# Patient Record
Sex: Female | Born: 1985 | Race: Black or African American | Hispanic: No | Marital: Single | Smoking: Current every day smoker
Health system: Southern US, Community
[De-identification: ages and names within clinical notes are randomized; demographics above are authoritative.]

## PROBLEM LIST (undated history)

## (undated) DIAGNOSIS — I10 Essential (primary) hypertension: Secondary | ICD-10-CM

## (undated) HISTORY — PX: LAPAROSCOPIC GASTRIC SLEEVE RESECTION: SHX5895

## (undated) HISTORY — PX: BREAST SURGERY: SHX581

---

## 2018-04-14 ENCOUNTER — Emergency Department (HOSPITAL_COMMUNITY): Payer: Medicaid - Out of State

## 2018-04-14 ENCOUNTER — Encounter (HOSPITAL_COMMUNITY): Payer: Self-pay

## 2018-04-14 ENCOUNTER — Other Ambulatory Visit: Payer: Self-pay

## 2018-04-14 ENCOUNTER — Emergency Department (HOSPITAL_COMMUNITY)
Admission: EM | Admit: 2018-04-14 | Discharge: 2018-04-15 | Disposition: A | Discharge status: Home / Self Care | Payer: Medicaid - Out of State | Attending: Emergency Medicine | Admitting: Emergency Medicine

## 2018-04-14 DIAGNOSIS — N3000 Acute cystitis without hematuria: Secondary | ICD-10-CM | POA: Insufficient documentation

## 2018-04-14 DIAGNOSIS — F17210 Nicotine dependence, cigarettes, uncomplicated: Secondary | ICD-10-CM | POA: Diagnosis not present

## 2018-04-14 DIAGNOSIS — R1031 Right lower quadrant pain: Secondary | ICD-10-CM | POA: Diagnosis present

## 2018-04-14 LAB — COMPREHENSIVE METABOLIC PANEL
ALT: 9 U/L (ref 0–44)
AST: 12 U/L — ABNORMAL LOW (ref 15–41)
Albumin: 3.8 g/dL (ref 3.5–5.0)
Alkaline Phosphatase: 49 U/L (ref 38–126)
Anion gap: 11 (ref 5–15)
BUN: 10 mg/dL (ref 6–20)
CO2: 25 mmol/L (ref 22–32)
Calcium: 8.7 mg/dL — ABNORMAL LOW (ref 8.9–10.3)
Chloride: 103 mmol/L (ref 98–111)
Creatinine, Ser: 0.67 mg/dL (ref 0.44–1.00)
GFR calc Af Amer: >60 mL/min (ref >60)
GFR calc non Af Amer: >60 mL/min (ref >60)
Glucose, Bld: 107 mg/dL — ABNORMAL HIGH (ref 70–99)
Potassium: 3.4 mmol/L — ABNORMAL LOW (ref 3.5–5.1)
Sodium: 139 mmol/L (ref 135–145)
Total Bilirubin: 0.6 mg/dL (ref 0.3–1.2)
Total Protein: 7.6 g/dL (ref 6.5–8.1)

## 2018-04-14 LAB — URINALYSIS, ROUTINE W REFLEX MICROSCOPIC
Bilirubin Urine: NEGATIVE
Glucose, UA: NEGATIVE mg/dL
Ketones, ur: NEGATIVE mg/dL
Nitrite: POSITIVE — AB
Protein, ur: 100 mg/dL — AB
Specific Gravity, Urine: 1.02 (ref 1.005–1.030)
WBC, UA: >50 WBC/hpf — ABNORMAL HIGH (ref 0–5)
pH: 6 (ref 5.0–8.0)

## 2018-04-14 LAB — CBC
HCT: 31.2 % — ABNORMAL LOW (ref 36.0–46.0)
Hemoglobin: 9.5 g/dL — ABNORMAL LOW (ref 12.0–15.0)
MCH: 22.2 pg — ABNORMAL LOW (ref 26.0–34.0)
MCHC: 30.4 g/dL (ref 30.0–36.0)
MCV: 72.9 fL — ABNORMAL LOW (ref 78.0–100.0)
Platelets: 190 10*3/uL (ref 150–400)
RBC: 4.28 MIL/uL (ref 3.87–5.11)
RDW: 17.8 % — ABNORMAL HIGH (ref 11.5–15.5)
WBC: 8.8 10*3/uL (ref 4.0–10.5)

## 2018-04-14 LAB — I-STAT BETA HCG BLOOD, ED (MC, WL, AP ONLY): I-stat hCG, quantitative: <5 m[IU]/mL (ref <5)

## 2018-04-14 LAB — LIPASE, BLOOD: Lipase: 23 U/L (ref 11–51)

## 2018-04-14 MED ORDER — SODIUM CHLORIDE 0.9 % IV SOLN
1.0000 g | Freq: Once | INTRAVENOUS | Status: AC
  Administered 2018-04-14: 1 g via INTRAVENOUS
  Filled 2018-04-14: qty 10

## 2018-04-14 MED ORDER — ACETAMINOPHEN 500 MG PO TABS
1000.0000 mg | ORAL_TABLET | Freq: Once | ORAL | Status: AC
  Administered 2018-04-14: 1000 mg via ORAL
  Filled 2018-04-14: qty 2

## 2018-04-14 MED ORDER — SODIUM CHLORIDE 0.9 % IV BOLUS
1000.0000 mL | Freq: Once | INTRAVENOUS | Status: AC
  Administered 2018-04-14: 1000 mL via INTRAVENOUS

## 2018-04-14 MED ORDER — IOPAMIDOL (ISOVUE-300) INJECTION 61%
INTRAVENOUS | Status: AC
  Filled 2018-04-14: qty 100

## 2018-04-14 MED ORDER — IOPAMIDOL (ISOVUE-300) INJECTION 61%
100.0000 mL | Freq: Once | INTRAVENOUS | Status: AC | PRN
  Administered 2018-04-14: 100 mL via INTRAVENOUS

## 2018-04-14 MED ORDER — HYDROMORPHONE HCL 1 MG/ML IJ SOLN
1.0000 mg | Freq: Once | INTRAMUSCULAR | Status: AC
  Administered 2018-04-14: 1 mg via INTRAVENOUS
  Filled 2018-04-14: qty 1

## 2018-04-14 MED ORDER — SODIUM CHLORIDE 0.9 % IJ SOLN
INTRAMUSCULAR | Status: AC
  Filled 2018-04-14: qty 50

## 2018-04-14 MED ORDER — ONDANSETRON HCL 4 MG/2ML IJ SOLN
4.0000 mg | Freq: Once | INTRAMUSCULAR | Status: AC
  Administered 2018-04-14: 4 mg via INTRAVENOUS
  Filled 2018-04-14: qty 2

## 2018-04-14 NOTE — ED Triage Notes (Signed)
Patient c/o headache, RLQ abdominal pain, and body aches x 2 days.

## 2018-04-14 NOTE — ED Notes (Signed)
Patient ambulated to BR with no assistance and tolerated well.  

## 2018-04-14 NOTE — ED Provider Notes (Signed)
[facility]-EMERGENCY DEPT Provider Note   CSN: [id number] Arrival date & time: 04/14/18  1755     History   Chief Complaint Chief Complaint  Patient presents with  . Headache  . Generalized Body Aches  . Abdominal Pain    HPI Bailey Sloan is a 32 y.o. female.  Patient presents to the ED with a chief complaint of abdominal pain.  She states that the symptoms started 2 days ago. She reports gradually worsening pain, with significant pain in her right lower abdomen.  She reports associated generalized body aches, fever, nausea, and vomiting.  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  She still has her gallbladder and appendix.  She has not been able to tolerate eating or drinking today.  She rates her pain as severe.  It does not radiate to the back.  It is worsened with movement and palpation. No urinary complaints.  The history is provided by the patient. No language interpreter was used.    History reviewed. No pertinent past medical history.  There are no active problems to display for this patient.   Past Surgical History:  Procedure Laterality Date  . BREAST SURGERY    . LAPAROSCOPIC GASTRIC SLEEVE RESECTION       OB History   None      Home Medications    Prior to Admission medications   Not on File    Family History Family History  Problem Relation Age of Onset  . Healthy Mother     Social History Social History   Tobacco Use  . Smoking status: Current Every Day Smoker    Packs/day: 0.25    Types: Cigarettes  . Smokeless tobacco: Never Used  Substance Use Topics  . Alcohol use: Yes    Comment: occasionally  . Drug use: Never     Allergies   Patient has no known allergies.   Review of Systems Review of Systems  All other systems reviewed and are negative.    Physical Exam Updated Vital Signs BP (!) 140/97 (BP Location: Left Arm)   Pulse (!) 112   Temp (!) 101.4 F (38.6 C) (Oral)   Resp 20    Ht 5\' 4"  (1.626 m)   Wt 65.8 kg   LMP 03/31/2018 (Approximate)   SpO2 98%   BMI 24.89 kg/m   Physical Exam  Constitutional: She is oriented to person, place, and time. She appears well-developed and well-nourished.  HENT:  Head: Normocephalic and atraumatic.  Eyes: Pupils are equal, round, and reactive to light. Conjunctivae and EOM are normal.  Neck: Normal range of motion. Neck supple.  Cardiovascular: Normal rate and regular rhythm. Exam reveals no gallop and no friction rub.  No murmur heard. Pulmonary/Chest: Effort normal and breath sounds normal. No respiratory distress. She has no wheezes. She has no rales. She exhibits no tenderness.  Abdominal: Soft. Bowel sounds are normal. She exhibits no distension and no mass. There is tenderness. There is no rebound and no guarding.  Diffuse abdominal tenderness  Musculoskeletal: Normal range of motion. She exhibits no edema or tenderness.  Neurological: She is alert and oriented to person, place, and time.  Skin: Skin is warm and dry.  Psychiatric: She has a normal mood and affect. Her behavior is normal. Judgment and thought content normal.  Nursing note and vitals reviewed.    ED Treatments / Results  Labs (all labs ordered are listed, but only abnormal results are displayed) Labs Reviewed  COMPREHENSIVE  METABOLIC PANEL - Abnormal; Notable for the following components:      Result Value   Potassium 3.4 (*)    Glucose, Bld 107 (*)    Calcium 8.7 (*)    AST 12 (*)    All other components within normal limits  CBC - Abnormal; Notable for the following components:   Hemoglobin 9.5 (*)    HCT 31.2 (*)    MCV 72.9 (*)    MCH 22.2 (*)    RDW 17.8 (*)    All other components within normal limits  LIPASE, BLOOD  URINALYSIS, ROUTINE W REFLEX MICROSCOPIC  I-STAT BETA HCG BLOOD, ED ([facility], [facility], [facility] ONLY)    EKG None  Radiology Ct Abdomen Pelvis W Contrast  Result Date: 04/14/2018 CLINICAL DATA:  Abdominal pain with fever EXAM:  CT ABDOMEN AND PELVIS WITH CONTRAST TECHNIQUE: Multidetector CT imaging of the abdomen and pelvis was performed using the standard protocol following bolus administration of intravenous contrast. CONTRAST:  ISOVUE-300 IOPAMIDOL (ISOVUE-300) INJECTION 61% COMPARISON:  None. FINDINGS: Lower chest: No acute abnormality. Hepatobiliary: No focal liver abnormality is seen. No gallstones, gallbladder wall thickening, or biliary dilatation. Pancreas: Unremarkable. No pancreatic ductal dilatation or surrounding inflammatory changes. Spleen: Normal in size without focal abnormality. Adrenals/Urinary Tract: Adrenal glands are unremarkable. Kidneys are normal, without renal calculi, focal lesion, or hydronephrosis. Bladder is unremarkable. Stomach/Bowel: Postsurgical changes of the stomach. No dilated small bowel. No colon wall thickening. Negative appendix. Vascular/Lymphatic: No significant vascular findings are present. No enlarged abdominal or pelvic lymph nodes. Reproductive: Uterus unremarkable. Ring-enhancing cyst in the right adnexa. Other: Small free fluid in the pelvis.  No free air. Musculoskeletal: No acute or significant osseous findings. IMPRESSION: No definite CT evidence for acute intra-abdominal or pelvic abnormality. Small amount of free fluid in the pelvis. Electronically Signed   By: Jasmine Pang M.D.   On: 04/14/2018 22:55    Procedures Procedures (including critical care time)  Medications Ordered in ED Medications  HYDROmorphone (DILAUDID) injection 1 mg (has no administration in time range)  ondansetron (ZOFRAN) injection 4 mg (has no administration in time range)  sodium chloride 0.9 % bolus 1,000 mL (has no administration in time range)  acetaminophen (TYLENOL) tablet 1,000 mg (has no administration in time range)     Initial Impression / Assessment and Plan / ED Course  I have reviewed the triage vital signs and the nursing notes.  Pertinent labs & imaging results that were  available during my care of the patient were reviewed by me and considered in my medical decision making (see chart for details).    Patient with significant abdominal tenderness, specifically in the RLQ.  Also has fever to 101.4.  No urinary complaints.  Will check CT to rule out appy. Negative HCG.  10:30 PM UA is consistent with UTI, pyelonephritis is strongly considered, but given about of RLQ tenderness, still feel that CT is needed to rule out appy.  CT negative for appy.  No other acute process identified on CT.  Will treat for UTI.  [place] narcotic database reviewed, no prior records found. Final Clinical Impressions(s) / ED Diagnoses   Final diagnoses:  Acute cystitis without hematuria    ED Discharge Orders         Ordered    HYDROcodone-acetaminophen (NORCO/VICODIN) 5-325 MG tablet  Every 6 hours PRN     04/15/18 0013    cephALEXin (KEFLEX) 500 MG capsule  2 times daily     04/15/18 0013  Roxy Horseman, PA-C 04/15/18 0014    Tilden Fossa, MD 04/16/18 [phone]

## 2018-04-14 NOTE — ED Notes (Signed)
Patient transported to CT 

## 2018-04-15 MED ORDER — CEPHALEXIN 500 MG PO CAPS: 500.0000 mg | ORAL_CAPSULE | Freq: Two times a day (BID) | ORAL | 0 refills | Status: DC

## 2018-04-15 MED ORDER — HYDROCODONE-ACETAMINOPHEN 5-325 MG PO TABS
2.0000 | ORAL_TABLET | Freq: Once | ORAL | Status: AC
  Administered 2018-04-15: 2 via ORAL
  Filled 2018-04-15: qty 2

## 2018-04-15 MED ORDER — HYDROCODONE-ACETAMINOPHEN 5-325 MG PO TABS: 1.0000 | ORAL_TABLET | Freq: Four times a day (QID) | ORAL | 0 refills | Status: DC | PRN

## 2019-03-14 ENCOUNTER — Encounter (HOSPITAL_COMMUNITY): Payer: Self-pay | Admitting: Emergency Medicine

## 2019-03-14 ENCOUNTER — Other Ambulatory Visit: Payer: Self-pay

## 2019-03-14 ENCOUNTER — Emergency Department (HOSPITAL_COMMUNITY): Payer: Medicaid Other

## 2019-03-14 ENCOUNTER — Emergency Department (HOSPITAL_COMMUNITY)
Admission: EM | Admit: 2019-03-14 | Discharge: 2019-03-14 | Disposition: A | Discharge status: Home / Self Care | Payer: Medicaid Other | Attending: Emergency Medicine | Admitting: Emergency Medicine

## 2019-03-14 DIAGNOSIS — Y999 Unspecified external cause status: Secondary | ICD-10-CM | POA: Diagnosis not present

## 2019-03-14 DIAGNOSIS — Y929 Unspecified place or not applicable: Secondary | ICD-10-CM | POA: Diagnosis not present

## 2019-03-14 DIAGNOSIS — S02832A Fracture of medial orbital wall, left side, initial encounter for closed fracture: Secondary | ICD-10-CM | POA: Insufficient documentation

## 2019-03-14 DIAGNOSIS — F17210 Nicotine dependence, cigarettes, uncomplicated: Secondary | ICD-10-CM | POA: Insufficient documentation

## 2019-03-14 DIAGNOSIS — S0993XA Unspecified injury of face, initial encounter: Secondary | ICD-10-CM | POA: Diagnosis present

## 2019-03-14 DIAGNOSIS — R109 Unspecified abdominal pain: Secondary | ICD-10-CM

## 2019-03-14 DIAGNOSIS — R9389 Abnormal findings on diagnostic imaging of other specified body structures: Secondary | ICD-10-CM | POA: Diagnosis not present

## 2019-03-14 DIAGNOSIS — T7611XA Adult physical abuse, suspected, initial encounter: Secondary | ICD-10-CM | POA: Diagnosis not present

## 2019-03-14 DIAGNOSIS — Y939 Activity, unspecified: Secondary | ICD-10-CM | POA: Diagnosis not present

## 2019-03-14 DIAGNOSIS — Q2547 Right aortic arch: Secondary | ICD-10-CM

## 2019-03-14 DIAGNOSIS — R0789 Other chest pain: Secondary | ICD-10-CM | POA: Insufficient documentation

## 2019-03-14 LAB — COMPREHENSIVE METABOLIC PANEL
ALT: 12 U/L (ref 0–44)
AST: 15 U/L (ref 15–41)
Albumin: 4.2 g/dL (ref 3.5–5.0)
Alkaline Phosphatase: 62 U/L (ref 38–126)
Anion gap: 13 (ref 5–15)
BUN: 8 mg/dL (ref 6–20)
CO2: 23 mmol/L (ref 22–32)
Calcium: 8.9 mg/dL (ref 8.9–10.3)
Chloride: 106 mmol/L (ref 98–111)
Creatinine, Ser: 0.66 mg/dL (ref 0.44–1.00)
GFR calc Af Amer: >60 mL/min (ref >60)
GFR calc non Af Amer: >60 mL/min (ref >60)
Glucose, Bld: 96 mg/dL (ref 70–99)
Potassium: 3.6 mmol/L (ref 3.5–5.1)
Sodium: 142 mmol/L (ref 135–145)
Total Bilirubin: 0.2 mg/dL — ABNORMAL LOW (ref 0.3–1.2)
Total Protein: 8 g/dL (ref 6.5–8.1)

## 2019-03-14 LAB — CBC WITH DIFFERENTIAL/PLATELET
Abs Immature Granulocytes: 0.02 10*3/uL (ref 0.00–0.07)
Basophils Absolute: 0 10*3/uL (ref 0.0–0.1)
Basophils Relative: 1 %
Eosinophils Absolute: 0 10*3/uL (ref 0.0–0.5)
Eosinophils Relative: 0 %
HCT: 31.1 % — ABNORMAL LOW (ref 36.0–46.0)
Hemoglobin: 9.1 g/dL — ABNORMAL LOW (ref 12.0–15.0)
Immature Granulocytes: 0 %
Lymphocytes Relative: 27 %
Lymphs Abs: 1.5 10*3/uL (ref 0.7–4.0)
MCH: 20.4 pg — ABNORMAL LOW (ref 26.0–34.0)
MCHC: 29.3 g/dL — ABNORMAL LOW (ref 30.0–36.0)
MCV: 69.9 fL — ABNORMAL LOW (ref 80.0–100.0)
Monocytes Absolute: 0.5 10*3/uL (ref 0.1–1.0)
Monocytes Relative: 8 %
Neutro Abs: 3.6 10*3/uL (ref 1.7–7.7)
Neutrophils Relative %: 64 %
Platelets: 287 10*3/uL (ref 150–400)
RBC: 4.45 MIL/uL (ref 3.87–5.11)
RDW: 19.6 % — ABNORMAL HIGH (ref 11.5–15.5)
WBC: 5.6 10*3/uL (ref 4.0–10.5)
nRBC: 0 % (ref 0.0–0.2)

## 2019-03-14 LAB — URINALYSIS, ROUTINE W REFLEX MICROSCOPIC
Bilirubin Urine: NEGATIVE
Glucose, UA: NEGATIVE mg/dL
Hgb urine dipstick: NEGATIVE
Ketones, ur: NEGATIVE mg/dL
Leukocytes,Ua: NEGATIVE
Nitrite: POSITIVE — AB
Protein, ur: NEGATIVE mg/dL
Specific Gravity, Urine: 1.042 — ABNORMAL HIGH (ref 1.005–1.030)
pH: 7 (ref 5.0–8.0)

## 2019-03-14 LAB — I-STAT BETA HCG BLOOD, ED (MC, WL, AP ONLY): I-stat hCG, quantitative: <5 m[IU]/mL (ref <5)

## 2019-03-14 MED ORDER — SODIUM CHLORIDE (PF) 0.9 % IJ SOLN
INTRAMUSCULAR | Status: AC
  Filled 2019-03-14: qty 50

## 2019-03-14 MED ORDER — CYCLOBENZAPRINE HCL 10 MG PO TABS: 10.0000 mg | ORAL_TABLET | Freq: Two times a day (BID) | ORAL | 0 refills | Status: DC | PRN

## 2019-03-14 MED ORDER — HYDROCODONE-ACETAMINOPHEN 5-325 MG PO TABS: 1.0000 | ORAL_TABLET | ORAL | 0 refills | Status: DC | PRN

## 2019-03-14 MED ORDER — MORPHINE SULFATE (PF) 4 MG/ML IV SOLN
4.0000 mg | Freq: Once | INTRAVENOUS | Status: AC
  Administered 2019-03-14: 16:00:00 4 mg via INTRAVENOUS
  Filled 2019-03-14: qty 1

## 2019-03-14 MED ORDER — MORPHINE SULFATE (PF) 4 MG/ML IV SOLN
4.0000 mg | Freq: Once | INTRAVENOUS | Status: AC
  Administered 2019-03-14: 12:00:00 4 mg via INTRAVENOUS
  Filled 2019-03-14: qty 1

## 2019-03-14 MED ORDER — ACETAMINOPHEN 500 MG PO TABS: 500.0000 mg | ORAL_TABLET | Freq: Four times a day (QID) | ORAL | 0 refills | Status: DC | PRN

## 2019-03-14 MED ORDER — CYCLOBENZAPRINE HCL 10 MG PO TABS
10.0000 mg | ORAL_TABLET | Freq: Once | ORAL | Status: AC
  Administered 2019-03-14: 16:00:00 10 mg via ORAL
  Filled 2019-03-14: qty 1

## 2019-03-14 MED ORDER — SODIUM CHLORIDE 0.9 % IV BOLUS
1000.0000 mL | Freq: Once | INTRAVENOUS | Status: AC
  Administered 2019-03-14: 12:00:00 1000 mL via INTRAVENOUS

## 2019-03-14 MED ORDER — KETOROLAC TROMETHAMINE 30 MG/ML IJ SOLN: 30.0000 mg | Freq: Once | INTRAMUSCULAR | Status: DC

## 2019-03-14 MED ORDER — IOHEXOL 350 MG/ML SOLN
100.0000 mL | Freq: Once | INTRAVENOUS | Status: AC | PRN
  Administered 2019-03-14: 100 mL via INTRAVENOUS
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
## 2019-03-14 NOTE — ED Notes (Signed)
Patient ambulated to the restroom with SBA. No issues observed. Gait steady.

## 2019-03-14 NOTE — ED Notes (Signed)
Pt requesting pain medication at this time. Reports pain is 6/10, located at lower right ribs. MD/PA/RN made aware.

## 2019-03-14 NOTE — ED Notes (Signed)
Patient transported to CT 
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
## 2019-05-17 ENCOUNTER — Emergency Department (HOSPITAL_COMMUNITY)
Admission: EM | Admit: 2019-05-17 | Discharge: 2019-05-17 | Disposition: A | Discharge status: Home / Self Care | Payer: Medicaid Other | Attending: Emergency Medicine | Admitting: Emergency Medicine

## 2019-05-17 ENCOUNTER — Other Ambulatory Visit: Payer: Self-pay

## 2019-05-17 ENCOUNTER — Encounter (HOSPITAL_COMMUNITY): Payer: Self-pay | Admitting: Emergency Medicine

## 2019-05-17 DIAGNOSIS — K0889 Other specified disorders of teeth and supporting structures: Secondary | ICD-10-CM

## 2019-05-17 DIAGNOSIS — F17210 Nicotine dependence, cigarettes, uncomplicated: Secondary | ICD-10-CM | POA: Insufficient documentation

## 2019-05-17 MED ORDER — HYDROCODONE-ACETAMINOPHEN 5-325 MG PO TABS
1.0000 | ORAL_TABLET | Freq: Once | ORAL | Status: AC
  Administered 2019-05-17: 1 via ORAL
  Filled 2019-05-17: qty 1

## 2019-05-17 MED ORDER — IBUPROFEN 600 MG PO TABS: 600.0000 mg | ORAL_TABLET | Freq: Four times a day (QID) | ORAL | 0 refills | Status: DC | PRN

## 2019-05-17 MED ORDER — PENICILLIN V POTASSIUM 500 MG PO TABS: 500.0000 mg | ORAL_TABLET | Freq: Four times a day (QID) | ORAL | 0 refills | Status: AC

## 2019-05-17 NOTE — ED Triage Notes (Signed)
Patient presents with complaints of face and back lower jaw tooth pain that radiates to the ear on the left. She reports this pain has lasted for 4 days and increased this morning.

## 2019-05-17 NOTE — ED Provider Notes (Signed)
[facility]-EMERGENCY DEPT Provider Note   CSN: [id number] Arrival date & time: 05/17/19  1314     History   Chief Complaint Chief Complaint  Patient presents with  . Dental Pain    HPI Bailey Sloan is a 33 y.o. female.     33 year old female presents with left third molar dental pain times several days.  Medicated with BC powders without resolve.  No fever or chills.  No facial swelling.  No trouble swallowing.     History reviewed. No pertinent past medical history.  There are no active problems to display for this patient.   Past Surgical History:  Procedure Laterality Date  . BREAST SURGERY    . LAPAROSCOPIC GASTRIC SLEEVE RESECTION       OB History   No obstetric history on file.      Home Medications    Prior to Admission medications   Medication Sig Start Date End Date Taking? Authorizing Provider  acetaminophen (TYLENOL) 500 MG tablet Take 1 tablet (500 mg total) by mouth every 6 (six) hours as needed. 03/14/19   Fawze, Mina A, PA-C  cephALEXin (KEFLEX) 500 MG capsule Take 1 capsule (500 mg total) by mouth 2 (two) times daily. Patient not taking: Reported on 03/14/2019 04/15/18   Roxy Horseman, PA-C  cyclobenzaprine (FLEXERIL) 10 MG tablet Take 1 tablet (10 mg total) by mouth 2 (two) times daily as needed for muscle spasms. 03/14/19   Fawze, Mina A, PA-C  HYDROcodone-acetaminophen (NORCO/VICODIN) 5-325 MG tablet Take 1 tablet by mouth every 4 (four) hours as needed for severe pain. 03/14/19   Jeanie Sewer, PA-C    Family History Family History  Problem Relation Age of Onset  . Healthy Mother     Social History Social History   Tobacco Use  . Smoking status: Current Every Day Smoker    Packs/day: 0.25    Types: Cigarettes  . Smokeless tobacco: Never Used  Substance Use Topics  . Alcohol use: Yes    Comment: occasionally  . Drug use: Never     Allergies   Patient has no known allergies.   Review of Systems Review  of Systems  All other systems reviewed and are negative.    Physical Exam Updated Vital Signs Temp 98.4 F (36.9 C) (Oral)   Resp 18   Ht 1.626 m (5\' 4" )   Wt 64.4 kg   BMI 24.37 kg/m   Physical Exam Vitals signs and nursing note reviewed.  Constitutional:      Appearance: She is well-developed. She is not toxic-appearing.  HENT:     Head: Normocephalic and atraumatic.     Mouth/Throat:      Comments: Slight erythema noted at gumline Eyes:     Conjunctiva/sclera: Conjunctivae normal.     Pupils: Pupils are equal, round, and reactive to light.  Neck:     Musculoskeletal: Normal range of motion.  Cardiovascular:     Rate and Rhythm: Normal rate.  Pulmonary:     Effort: Pulmonary effort is normal.  Skin:    General: Skin is warm and dry.  Neurological:     Mental Status: She is alert and oriented to person, place, and time.      ED Treatments / Results  Labs (all labs ordered are listed, but only abnormal results are displayed) Labs Reviewed - No data to display  EKG None  Radiology No results found.  Procedures Procedures (including critical care time)  Medications Ordered in ED  Medications - No data to display   Initial Impression / Assessment and Plan / ED Course  I have reviewed the triage vital signs and the nursing notes.  Pertinent labs & imaging results that were available during my care of the patient were reviewed by me and considered in my medical decision making (see chart for details).        Patient placed on penicillin encouraged to use ibuprofen for pain  Final Clinical Impressions(s) / ED Diagnoses   Final diagnoses:  None    ED Discharge Orders    None       Lacretia Leigh, MD 05/17/19 1343

## 2019-11-02 ENCOUNTER — Emergency Department (HOSPITAL_COMMUNITY)
Admission: EM | Admit: 2019-11-02 | Discharge: 2019-11-02 | Disposition: A | Discharge status: Home / Self Care | Payer: Medicaid Other | Attending: Emergency Medicine | Admitting: Emergency Medicine

## 2019-11-02 ENCOUNTER — Encounter (HOSPITAL_COMMUNITY): Payer: Self-pay | Admitting: Emergency Medicine

## 2019-11-02 ENCOUNTER — Other Ambulatory Visit: Payer: Self-pay

## 2019-11-02 DIAGNOSIS — R5383 Other fatigue: Secondary | ICD-10-CM | POA: Diagnosis not present

## 2019-11-02 DIAGNOSIS — R519 Headache, unspecified: Secondary | ICD-10-CM | POA: Diagnosis present

## 2019-11-02 DIAGNOSIS — Z79899 Other long term (current) drug therapy: Secondary | ICD-10-CM | POA: Insufficient documentation

## 2019-11-02 DIAGNOSIS — F17210 Nicotine dependence, cigarettes, uncomplicated: Secondary | ICD-10-CM | POA: Insufficient documentation

## 2019-11-02 DIAGNOSIS — Z9884 Bariatric surgery status: Secondary | ICD-10-CM | POA: Insufficient documentation

## 2019-11-02 MED ORDER — METOCLOPRAMIDE HCL 5 MG/ML IJ SOLN
10.0000 mg | Freq: Once | INTRAMUSCULAR | Status: AC
  Administered 2019-11-02: 10 mg via INTRAMUSCULAR
  Filled 2019-11-02: qty 2

## 2019-11-02 MED ORDER — DIPHENHYDRAMINE HCL 50 MG/ML IJ SOLN
25.0000 mg | Freq: Once | INTRAMUSCULAR | Status: AC
  Administered 2019-11-02: 25 mg via INTRAMUSCULAR
  Filled 2019-11-02: qty 1

## 2019-11-02 MED ORDER — KETOROLAC TROMETHAMINE 30 MG/ML IJ SOLN
30.0000 mg | Freq: Once | INTRAMUSCULAR | Status: AC
  Administered 2019-11-02: 05:00:00 30 mg via INTRAMUSCULAR
  Filled 2019-11-02: qty 1

## 2019-11-02 MED ORDER — ONDANSETRON HCL 4 MG/2ML IJ SOLN
8.0000 mg | Freq: Once | INTRAMUSCULAR | Status: AC
  Administered 2019-11-02: 8 mg via INTRAMUSCULAR
  Filled 2019-11-02: qty 4

## 2019-11-02 NOTE — ED Triage Notes (Signed)
Pt arriving with migraine that began on the 17th. Pt reports she took Tyleonl PM, Advil, and Midol at 2330 with no relief. Pt also having sensitivity to light.

## 2019-11-02 NOTE — ED Provider Notes (Signed)
[facility]-EMERGENCY DEPT Provider Note   CSN: [id number] Arrival date & time: 11/02/19  0250     History Chief Complaint  Patient presents with  . Migraine    Bailey Sloan is a 34 y.o. female.  Patient here with "migraine" headache for the past 3 days.  States it started 3 days ago in her right posterior head and radiates to her right head.  She is taking Tylenol, Advil and Midol without relief.  States she had several episodes of dry heaving with nausea.  Does have photophobia and phonophobia.  This is similar to previous migraines.  He has been seen in the hospital in the past for similar headaches.  No focal weakness, numbness or tingling.  Denies thunderclap onset.  Denies any head trauma.  Denies any fever.  No chest pain or shortness of breath.  Headache was gradual in onset migraines.  The history is provided by the patient.  Migraine Associated symptoms include headaches. Pertinent negatives include no chest pain, no abdominal pain and no shortness of breath.       History reviewed. No pertinent past medical history.  There are no problems to display for this patient.   Past Surgical History:  Procedure Laterality Date  . BREAST SURGERY    . LAPAROSCOPIC GASTRIC SLEEVE RESECTION       OB History   No obstetric history on file.     Family History  Problem Relation Age of Onset  . Healthy Mother     Social History   Tobacco Use  . Smoking status: Current Every Day Smoker    Packs/day: 0.25    Types: Cigarettes  . Smokeless tobacco: Never Used  Substance Use Topics  . Alcohol use: Yes    Comment: occasionally  . Drug use: Never    Home Medications Prior to Admission medications   Medication Sig Start Date End Date Taking? Authorizing Provider  acetaminophen (TYLENOL) 500 MG tablet Take 1 tablet (500 mg total) by mouth every 6 (six) hours as needed. 03/14/19   Fawze, Mina A, PA-C  cephALEXin (KEFLEX) 500 MG capsule Take 1  capsule (500 mg total) by mouth 2 (two) times daily. Patient not taking: Reported on 03/14/2019 04/15/18   Roxy Horseman, PA-C  cyclobenzaprine (FLEXERIL) 10 MG tablet Take 1 tablet (10 mg total) by mouth 2 (two) times daily as needed for muscle spasms. 03/14/19   Fawze, Mina A, PA-C  HYDROcodone-acetaminophen (NORCO/VICODIN) 5-325 MG tablet Take 1 tablet by mouth every 4 (four) hours as needed for severe pain. 03/14/19   Fawze, Mina A, PA-C  ibuprofen (ADVIL) 600 MG tablet Take 1 tablet (600 mg total) by mouth every 6 (six) hours as needed. 05/17/19   Lorre Nick, MD    Allergies    Patient has no known allergies.  Review of Systems   Review of Systems  Constitutional: Positive for fatigue. Negative for activity change, appetite change and fever.  HENT: Negative for congestion and rhinorrhea.   Eyes: Positive for photophobia. Negative for visual disturbance.  Respiratory: Negative for cough, chest tightness and shortness of breath.   Cardiovascular: Negative for chest pain.  Gastrointestinal: Positive for nausea and vomiting. Negative for abdominal pain.  Genitourinary: Negative for dysuria and hematuria.  Musculoskeletal: Negative for myalgias.  Neurological: Positive for headaches. Negative for dizziness, weakness and light-headedness.    Physical Exam Updated Vital Signs BP (!) 164/99 (BP Location: Left Arm)   Pulse 84   Temp 98.1 F (36.7 C) (  Oral)   Resp 16   Ht 5\' 4"  (1.626 m)   Wt 64.4 kg   LMP 09/19/2019 (Approximate)   SpO2 100%   BMI 24.37 kg/m   Physical Exam Vitals and nursing note reviewed.  Constitutional:      General: She is not in acute distress.    Appearance: Normal appearance. She is well-developed and normal weight.  HENT:     Head: Normocephalic and atraumatic.     Comments: No temporal artery tenderness    Mouth/Throat:     Pharynx: No oropharyngeal exudate.  Eyes:     Conjunctiva/sclera: Conjunctivae normal.     Pupils: Pupils are equal,  round, and reactive to light.     Comments: Photophobic  Neck:     Comments: No meningismus. Cardiovascular:     Rate and Rhythm: Normal rate and regular rhythm.     Heart sounds: Normal heart sounds. No murmur.  Pulmonary:     Effort: Pulmonary effort is normal. No respiratory distress.     Breath sounds: Normal breath sounds.  Abdominal:     Palpations: Abdomen is soft.     Tenderness: There is no abdominal tenderness. There is no guarding or rebound.  Musculoskeletal:        General: No tenderness. Normal range of motion.     Cervical back: Normal range of motion and neck supple.  Skin:    General: Skin is warm.     Capillary Refill: Capillary refill takes less than 2 seconds.  Neurological:     General: No focal deficit present.     Mental Status: She is alert and oriented to person, place, and time. Mental status is at baseline.     Cranial Nerves: No cranial nerve deficit.     Motor: No abnormal muscle tone.     Coordination: Coordination normal.     Comments:  5/5 strength throughout. CN 2-12 intact.Equal grip strength.   Psychiatric:        Behavior: Behavior normal.     ED Results / Procedures / Treatments   Labs (all labs ordered are listed, but only abnormal results are displayed) Labs Reviewed - No data to display  EKG None  Radiology No results found.  Procedures Procedures (including critical care time)  Medications Ordered in ED Medications  ketorolac (TORADOL) 30 MG/ML injection 30 mg (30 mg Intramuscular Given 11/02/19 0529)  ondansetron (ZOFRAN) injection 8 mg (8 mg Intramuscular Given 11/02/19 0528)  metoCLOPramide (REGLAN) injection 10 mg (10 mg Intramuscular Given 11/02/19 0529)  diphenhydrAMINE (BENADRYL) injection 25 mg (25 mg Intramuscular Given 11/02/19 0529)    ED Course  I have reviewed the triage vital signs and the nursing notes.  Pertinent labs & imaging results that were available during my care of the patient were reviewed by me and  considered in my medical decision making (see chart for details).    MDM Rules/Calculators/A&P                     Gradual onset headache similar to previous.  No thunderclap onset.  No fever.  Neurological exam is intact.  Low suspicion for subarachnoid hemorrhage, meningitis, temporal arteritis  Patient had resolution of her headache with the above medications.  She is neurologically intact, smiling, tolerating p.o. and ambulatory.  She has the lights on in the room and is walking around stating she is ready to go home.  She is informed of her elevated blood pressure need for follow-up.  Will give referral to neurology. Follow-up with primary physician as well.  Return to the ED with sudden onset headache, unilateral weakness, numbness, tingling, visual changes, difficulty speaking, difficulty swallowing or other concerns. Final Clinical Impression(s) / ED Diagnoses Final diagnoses:  Bad headache    Rx / DC Orders ED Discharge Orders    None       Desia Saban, Jeannett Senior, MD 11/02/19 [phone]

## 2019-11-02 NOTE — Discharge Instructions (Addendum)
Follow-up with your neurologist.  Return to the ED if your headache becomes sudden onset, associated with unilateral weakness, numbness, tingling, fever or vomiting.  Your blood pressure today is elevated and should be followed up by primary doctor.

## 2019-12-29 ENCOUNTER — Emergency Department (HOSPITAL_COMMUNITY): Payer: Medicaid Other

## 2019-12-29 ENCOUNTER — Other Ambulatory Visit: Payer: Self-pay

## 2019-12-29 ENCOUNTER — Emergency Department (HOSPITAL_COMMUNITY)
Admission: EM | Admit: 2019-12-29 | Discharge: 2019-12-29 | Disposition: A | Discharge status: Home / Self Care | Payer: Medicaid Other | Attending: Emergency Medicine | Admitting: Emergency Medicine

## 2019-12-29 DIAGNOSIS — R519 Headache, unspecified: Secondary | ICD-10-CM | POA: Insufficient documentation

## 2019-12-29 DIAGNOSIS — R55 Syncope and collapse: Secondary | ICD-10-CM | POA: Diagnosis present

## 2019-12-29 DIAGNOSIS — F17210 Nicotine dependence, cigarettes, uncomplicated: Secondary | ICD-10-CM | POA: Insufficient documentation

## 2019-12-29 DIAGNOSIS — N3000 Acute cystitis without hematuria: Secondary | ICD-10-CM | POA: Diagnosis not present

## 2019-12-29 LAB — URINALYSIS, ROUTINE W REFLEX MICROSCOPIC
Bilirubin Urine: NEGATIVE
Glucose, UA: NEGATIVE mg/dL
Hgb urine dipstick: NEGATIVE
Ketones, ur: NEGATIVE mg/dL
Leukocytes,Ua: NEGATIVE
Nitrite: POSITIVE — AB
Protein, ur: NEGATIVE mg/dL
Specific Gravity, Urine: 1.02 (ref 1.005–1.030)
pH: 6 (ref 5.0–8.0)

## 2019-12-29 LAB — CBC
HCT: 28.1 % — ABNORMAL LOW (ref 36.0–46.0)
HCT: 25.7 % — ABNORMAL LOW (ref 36.0–46.0)
Hemoglobin: 7.8 g/dL — ABNORMAL LOW (ref 12.0–15.0)
Hemoglobin: 7.2 g/dL — ABNORMAL LOW (ref 12.0–15.0)
MCH: 18.3 pg — ABNORMAL LOW (ref 26.0–34.0)
MCH: 18.4 pg — ABNORMAL LOW (ref 26.0–34.0)
MCHC: 27.8 g/dL — ABNORMAL LOW (ref 30.0–36.0)
MCHC: 28 g/dL — ABNORMAL LOW (ref 30.0–36.0)
MCV: 66 fL — ABNORMAL LOW (ref 80.0–100.0)
MCV: 65.6 fL — ABNORMAL LOW (ref 80.0–100.0)
Platelets: 209 10*3/uL (ref 150–400)
Platelets: 188 10*3/uL (ref 150–400)
RBC: 4.26 MIL/uL (ref 3.87–5.11)
RBC: 3.92 MIL/uL (ref 3.87–5.11)
RDW: 20 % — ABNORMAL HIGH (ref 11.5–15.5)
RDW: 19.9 % — ABNORMAL HIGH (ref 11.5–15.5)
WBC: 4.7 10*3/uL (ref 4.0–10.5)
WBC: 4.8 10*3/uL (ref 4.0–10.5)
nRBC: 0 % (ref 0.0–0.2)
nRBC: 0 % (ref 0.0–0.2)

## 2019-12-29 LAB — I-STAT BETA HCG BLOOD, ED (MC, WL, AP ONLY): I-stat hCG, quantitative: <5 m[IU]/mL (ref <5)

## 2019-12-29 LAB — BASIC METABOLIC PANEL
Anion gap: 8 (ref 5–15)
BUN: 10 mg/dL (ref 6–20)
CO2: 25 mmol/L (ref 22–32)
Calcium: 8.8 mg/dL — ABNORMAL LOW (ref 8.9–10.3)
Chloride: 108 mmol/L (ref 98–111)
Creatinine, Ser: 0.7 mg/dL (ref 0.44–1.00)
GFR calc Af Amer: >60 mL/min (ref >60)
GFR calc non Af Amer: >60 mL/min (ref >60)
Glucose, Bld: 93 mg/dL (ref 70–99)
Potassium: 3.5 mmol/L (ref 3.5–5.1)
Sodium: 141 mmol/L (ref 135–145)

## 2019-12-29 MED ORDER — CEPHALEXIN 500 MG PO CAPS: 500.0000 mg | ORAL_CAPSULE | Freq: Two times a day (BID) | ORAL | 0 refills | Status: DC

## 2019-12-29 MED ORDER — AMLODIPINE BESYLATE 5 MG PO TABS: 5.0000 mg | ORAL_TABLET | Freq: Every day | ORAL | 0 refills | Status: DC

## 2019-12-29 MED ORDER — SODIUM CHLORIDE 0.9% FLUSH
3.0000 mL | Freq: Once | INTRAVENOUS | Status: AC
  Administered 2019-12-29: 3 mL via INTRAVENOUS

## 2019-12-29 MED ORDER — ACETAMINOPHEN 325 MG PO TABS
650.0000 mg | ORAL_TABLET | Freq: Once | ORAL | Status: AC
  Administered 2019-12-29: 650 mg via ORAL
  Filled 2019-12-29: qty 2

## 2019-12-29 MED ORDER — SODIUM CHLORIDE 0.9 % IV BOLUS
500.0000 mL | Freq: Once | INTRAVENOUS | Status: AC
  Administered 2019-12-29: 500 mL via INTRAVENOUS

## 2019-12-29 MED ORDER — CEPHALEXIN 500 MG PO CAPS: 500.0000 mg | ORAL_CAPSULE | Freq: Two times a day (BID) | ORAL | 0 refills | Status: AC

## 2019-12-29 NOTE — ED Triage Notes (Signed)
Pt arrives pov after syncopal event today at work. Pt states her BP when that happened was 170/120. Denies hx of BP. Pt c/o headache.

## 2019-12-29 NOTE — ED Notes (Signed)
Pt ambulated to restroom with steady gait.

## 2019-12-29 NOTE — ED Notes (Signed)
Pt complaining of right sided headache 6/10, endorses photophobia, denies N&V.

## 2019-12-29 NOTE — ED Provider Notes (Signed)
[facility] EMERGENCY DEPARTMENT Provider Note   CSN: [id number] Arrival date & time: 12/29/19  1418     History Chief Complaint  Patient presents with  . Loss of Consciousness    Bailey Sloan is a 34 y.o. female.  Patient had near syncopal event after standing up at work.  She got lightheaded felt faint fell to the ground.  Hit the right side of her head but did not lose consciousness.  Has felt fine since then.  Overall has mild right-sided headache but no other symptoms currently.  She denies any new medications.  Denies any alcohol or drug use.  The history is provided by the patient.  Near Syncope This is a new problem. The current episode started 6 to 12 hours ago. The problem has been resolved. Associated symptoms include headaches (mild right sided headache). Pertinent negatives include no chest pain, no abdominal pain and no shortness of breath. Nothing aggravates the symptoms. Nothing relieves the symptoms. She has tried nothing for the symptoms. The treatment provided no relief.       No past medical history on file.  There are no problems to display for this patient.   Past Surgical History:  Procedure Laterality Date  . BREAST SURGERY    . LAPAROSCOPIC GASTRIC SLEEVE RESECTION       OB History   No obstetric history on file.     Family History  Problem Relation Age of Onset  . Healthy Mother     Social History   Tobacco Use  . Smoking status: Current Every Day Smoker    Packs/day: 0.25    Types: Cigarettes  . Smokeless tobacco: Never Used  Vaping Use  . Vaping Use: Never used  Substance Use Topics  . Alcohol use: Yes    Comment: occasionally  . Drug use: Never    Home Medications Prior to Admission medications   Medication Sig Start Date End Date Taking? Authorizing Provider  Ibuprofen-diphenhydrAMINE Cit (MOTRIN PM) 200-38 MG TABS Take 2.5 tablets by mouth as needed (for migraines).   Yes [provider]    acetaminophen (TYLENOL) 500 MG tablet Take 1 tablet (500 mg total) by mouth every 6 (six) hours as needed. Patient not taking: Reported on 12/29/2019 03/14/19   Rodell Perna A, PA-C  amLODipine (NORVASC) 5 MG tablet Take 1 tablet (5 mg total) by mouth daily. 12/29/19 01/28/20  Allan Minotti, DO  cephALEXin (KEFLEX) 500 MG capsule Take 1 capsule (500 mg total) by mouth 2 (two) times daily for 5 days. 12/29/19 01/03/20  Garion Wempe, DO  cephALEXin (KEFLEX) 500 MG capsule Take 1 capsule (500 mg total) by mouth 2 (two) times daily. 12/29/19   Karrington Mccravy, DO  cyclobenzaprine (FLEXERIL) 10 MG tablet Take 1 tablet (10 mg total) by mouth 2 (two) times daily as needed for muscle spasms. Patient not taking: Reported on 12/29/2019 03/14/19   Rodell Perna A, PA-C  HYDROcodone-acetaminophen (NORCO/VICODIN) 5-325 MG tablet Take 1 tablet by mouth every 4 (four) hours as needed for severe pain. Patient not taking: Reported on 12/29/2019 03/14/19   Rodell Perna A, PA-C  ibuprofen (ADVIL) 600 MG tablet Take 1 tablet (600 mg total) by mouth every 6 (six) hours as needed. Patient not taking: Reported on 12/29/2019 05/17/19   Lacretia Leigh, MD    Allergies    Patient has no known allergies.  Review of Systems   Review of Systems  Constitutional: Negative for chills and fever.  HENT: Negative for  ear pain and sore throat.   Eyes: Negative for pain and visual disturbance.  Respiratory: Negative for cough and shortness of breath.   Cardiovascular: Positive for near-syncope. Negative for chest pain and palpitations.  Gastrointestinal: Negative for abdominal pain and vomiting.  Genitourinary: Negative for dysuria and hematuria.  Musculoskeletal: Negative for arthralgias and back pain.  Skin: Negative for color change and rash.  Neurological: Positive for syncope (near syncope) and headaches (mild right sided headache). Negative for seizures.  All other systems reviewed and are negative.   Physical Exam Updated  Vital Signs  ED Triage Vitals  Enc Vitals Group     BP 12/29/19 1422 (!) 157/97     Pulse Rate 12/29/19 1422 (!) 105     Resp 12/29/19 1422 16     Temp 12/29/19 1422 98.6 F (37 C)     Temp Source 12/29/19 1422 Oral     SpO2 12/29/19 1422 99 %     Weight 12/29/19 1600 156 lb (70.8 kg)     Height 12/29/19 1600 5\' 3"  (1.6 m)     Head Circumference --      Peak Flow --      Pain Score 12/29/19 1600 6     Pain Loc --      Pain Edu? --      Excl. in GC? --     Physical Exam Vitals and nursing note reviewed.  Constitutional:      General: She is not in acute distress.    Appearance: She is well-developed. She is not ill-appearing.  HENT:     Head: Normocephalic and atraumatic.     Nose: Nose normal.     Mouth/Throat:     Mouth: Mucous membranes are moist.     Pharynx: No oropharyngeal exudate or posterior oropharyngeal erythema.  Eyes:     Extraocular Movements: Extraocular movements intact.     Conjunctiva/sclera: Conjunctivae normal.     Pupils: Pupils are equal, round, and reactive to light.  Cardiovascular:     Rate and Rhythm: Normal rate and regular rhythm.     Pulses: Normal pulses.     Heart sounds: Normal heart sounds. No murmur heard.   Pulmonary:     Effort: Pulmonary effort is normal. No respiratory distress.     Breath sounds: Normal breath sounds.  Abdominal:     General: Abdomen is flat.     Palpations: Abdomen is soft.     Tenderness: There is no abdominal tenderness.  Musculoskeletal:        General: No tenderness. Normal range of motion.     Cervical back: Normal range of motion and neck supple. No tenderness.  Skin:    General: Skin is warm and dry.     Capillary Refill: Capillary refill takes less than 2 seconds.  Neurological:     General: No focal deficit present.     Mental Status: She is alert and oriented to person, place, and time.     Cranial Nerves: No cranial nerve deficit.     Sensory: No sensory deficit.     Motor: No weakness.      Coordination: Coordination normal.     Comments: 5+ out of 5 strength throughout, normal sensation, no drift, normal finger-to-nose finger  Psychiatric:        Mood and Affect: Mood normal.     ED Results / Procedures / Treatments   Labs (all labs ordered are listed, but only abnormal results are displayed) Labs  Reviewed  BASIC METABOLIC PANEL - Abnormal; Notable for the following components:      Result Value   Calcium 8.8 (*)    All other components within normal limits  CBC - Abnormal; Notable for the following components:   Hemoglobin 7.8 (*)    HCT 28.1 (*)    MCV 66.0 (*)    MCH 18.3 (*)    MCHC 27.8 (*)    RDW 20.0 (*)    All other components within normal limits  URINALYSIS, ROUTINE W REFLEX MICROSCOPIC - Abnormal; Notable for the following components:   APPearance HAZY (*)    Nitrite POSITIVE (*)    Bacteria, UA MANY (*)    All other components within normal limits  CBC - Abnormal; Notable for the following components:   Hemoglobin 7.2 (*)    HCT 25.7 (*)    MCV 65.6 (*)    MCH 18.4 (*)    MCHC 28.0 (*)    RDW 19.9 (*)    All other components within normal limits  I-STAT BETA HCG BLOOD, ED ([facility], [facility], [facility] ONLY)  CBG MONITORING, ED    EKG EKG Interpretation  Date/Time:  Wednesday December 29 2019 15:58:52 EDT Ventricular Rate:  70 PR Interval:  122 QRS Duration: 86 QT Interval:  412 QTC Calculation: 444 R Axis:   58 Text Interpretation: Normal sinus rhythm with sinus arrhythmia Normal ECG Confirmed by Virgina Norfolk [phone]) on 12/29/2019 7:52:29 PM   Radiology CT Head Wo Contrast  Result Date: 12/29/2019 CLINICAL DATA:  Headache syncopal event EXAM: CT HEAD WITHOUT CONTRAST TECHNIQUE: Contiguous axial images were obtained from the base of the skull through the vertex without intravenous contrast. COMPARISON:  CT 03/14/2019 FINDINGS: Brain: No evidence of acute infarction, hemorrhage, hydrocephalus, extra-axial collection or mass lesion/mass effect. Vascular: No  hyperdense vessel or unexpected calcification. Skull: Normal. Negative for fracture or focal lesion. Sinuses/Orbits: Old fracture deformity medial wall left orbit. Other: None IMPRESSION: Negative non contrasted CT appearance of the brain Electronically Signed   By: Jasmine Pang M.D.   On: 12/29/2019 21:45    Procedures Procedures (including critical care time)  Medications Ordered in ED Medications  sodium chloride flush (NS) 0.9 % injection 3 mL (3 mLs Intravenous Given 12/29/19 2039)  sodium chloride 0.9 % bolus 500 mL (0 mLs Intravenous Stopped 12/29/19 2159)  acetaminophen (TYLENOL) tablet 650 mg (650 mg Oral Given 12/29/19 2037)    ED Course  I have reviewed the triage vital signs and the nursing notes.  Pertinent labs & imaging results that were available during my care of the patient were reviewed by me and considered in my medical decision making (see chart for details).    MDM Rules/Calculators/A&P                          Bailey Sloan is a 34 year old female with no significant medical history presents the ED after near syncope event.  Patient with mildly elevated blood pressure but otherwise normal vitals.  Patient states that she stood up quickly and felt lightheaded and fell to the ground.  She thinks that she hit the right side of her head.  States that she did not lose consciousness.  But has right-sided headache now.  Normal neurological exam.  Overall she appears well.  Blood pressure is mildly elevated.  Does not have some right-sided headache but believes that she hit her head.  We will get a head CT to  rule out injury/head bleed.  Not consistent with a subarachnoid hemorrhage.  Patient otherwise had lab work done prior to my evaluation.  Hemoglobin is 7.8.  Last year was around 9.  States that she has heavy menstrual cycles.  MCV is less than 70.  Likely iron deficient anemia.  Denies any black stools or bloody stools.  No urinary symptoms.  No abdominal pain.  Will check  urinalysis, head CT.  Will give fluid bolus.  Will likely have her follow-up with OB to talk about possible birth control will have her start iron.  Urinalysis concerning for infection.  Will start Keflex.  Repeat hemoglobin is stable around 7.2.  Brown stool on exam.  Suspect iron deficient anemia secondary to heavy menstrual cycles.  We will have her follow-up with OB.  She is overall asymptomatic here.  She is able to ambulate without any problems.  She will start iron.  Given amlodipine for elevated blood pressure.  She understands return precautions for possible transfusion if she continues to have episodes of chest pain, shortness of breath, dizziness.  Did offer her possible admission for possible transfusion but she felt well enough to go home and understands reasons to come back.  Patient was discharged in good condition.  This chart was dictated using voice recognition software.  Despite best efforts to proofread,  errors can occur which can change the documentation meaning.    Final Clinical Impression(s) / ED Diagnoses Final diagnoses:  Near syncope  Acute cystitis without hematuria    Rx / DC Orders ED Discharge Orders         Ordered    cephALEXin (KEFLEX) 500 MG capsule  2 times daily     Discontinue  Reprint     12/29/19 2257    cephALEXin (KEFLEX) 500 MG capsule  2 times daily     Discontinue  Reprint     12/29/19 2257    amLODipine (NORVASC) 5 MG tablet  Daily     Discontinue  Reprint     12/29/19 2257           Virgina Norfolk, DO 12/29/19 2355

## 2019-12-29 NOTE — Discharge Instructions (Addendum)
Take over-the-counter iron pills for likely iron deficiency anemia.  Follow-up with [facility] clinic.  Take antibiotic as prescribed.  Take amlodipine for blood pressure.  Please return to the ED if you have more symptoms as discussed.

## 2019-12-29 NOTE — ED Notes (Signed)
Discharge instructions reviewed with pt. Pt verbalized understanding.   

## 2020-11-26 ENCOUNTER — Other Ambulatory Visit: Payer: Self-pay

## 2020-11-26 ENCOUNTER — Emergency Department (HOSPITAL_COMMUNITY): Payer: Medicaid Other

## 2020-11-26 ENCOUNTER — Encounter (HOSPITAL_COMMUNITY): Payer: Self-pay

## 2020-11-26 ENCOUNTER — Observation Stay (HOSPITAL_COMMUNITY)
Admission: EM | Admit: 2020-11-26 | Discharge: 2020-11-27 | Disposition: A | Discharge status: Home / Self Care | Payer: Medicaid Other | Attending: Internal Medicine | Admitting: Internal Medicine

## 2020-11-26 DIAGNOSIS — Z79899 Other long term (current) drug therapy: Secondary | ICD-10-CM | POA: Diagnosis not present

## 2020-11-26 DIAGNOSIS — D519 Vitamin B12 deficiency anemia, unspecified: Secondary | ICD-10-CM | POA: Insufficient documentation

## 2020-11-26 DIAGNOSIS — D509 Iron deficiency anemia, unspecified: Principal | ICD-10-CM | POA: Insufficient documentation

## 2020-11-26 DIAGNOSIS — R0602 Shortness of breath: Secondary | ICD-10-CM | POA: Diagnosis present

## 2020-11-26 DIAGNOSIS — F17210 Nicotine dependence, cigarettes, uncomplicated: Secondary | ICD-10-CM | POA: Insufficient documentation

## 2020-11-26 DIAGNOSIS — U071 COVID-19: Secondary | ICD-10-CM | POA: Insufficient documentation

## 2020-11-26 DIAGNOSIS — I10 Essential (primary) hypertension: Secondary | ICD-10-CM | POA: Diagnosis not present

## 2020-11-26 DIAGNOSIS — D649 Anemia, unspecified: Secondary | ICD-10-CM | POA: Diagnosis present

## 2020-11-26 DIAGNOSIS — R079 Chest pain, unspecified: Secondary | ICD-10-CM

## 2020-11-26 LAB — HEPATIC FUNCTION PANEL
ALT: 10 U/L (ref 0–44)
AST: 12 U/L — ABNORMAL LOW (ref 15–41)
Albumin: 3.4 g/dL — ABNORMAL LOW (ref 3.5–5.0)
Alkaline Phosphatase: 47 U/L (ref 38–126)
Bilirubin, Direct: <0.1 mg/dL (ref 0.0–0.2)
Total Bilirubin: 0.4 mg/dL (ref 0.3–1.2)
Total Protein: 6.8 g/dL (ref 6.5–8.1)

## 2020-11-26 LAB — BASIC METABOLIC PANEL
Anion gap: 7 (ref 5–15)
BUN: 12 mg/dL (ref 6–20)
CO2: 21 mmol/L — ABNORMAL LOW (ref 22–32)
Calcium: 8.4 mg/dL — ABNORMAL LOW (ref 8.9–10.3)
Chloride: 110 mmol/L (ref 98–111)
Creatinine, Ser: 0.64 mg/dL (ref 0.44–1.00)
GFR, Estimated: >60 mL/min (ref >60)
Glucose, Bld: 90 mg/dL (ref 70–99)
Potassium: 3.7 mmol/L (ref 3.5–5.1)
Sodium: 138 mmol/L (ref 135–145)

## 2020-11-26 LAB — CBC
HCT: 24.1 % — ABNORMAL LOW (ref 36.0–46.0)
Hemoglobin: 6.7 g/dL — CL (ref 12.0–15.0)
MCH: 17.6 pg — ABNORMAL LOW (ref 26.0–34.0)
MCHC: 27.8 g/dL — ABNORMAL LOW (ref 30.0–36.0)
MCV: 63.4 fL — ABNORMAL LOW (ref 80.0–100.0)
Platelets: 282 10*3/uL (ref 150–400)
RBC: 3.8 MIL/uL — ABNORMAL LOW (ref 3.87–5.11)
RDW: 21.3 % — ABNORMAL HIGH (ref 11.5–15.5)
WBC: 4.1 10*3/uL (ref 4.0–10.5)
nRBC: 0 % (ref 0.0–0.2)

## 2020-11-26 LAB — I-STAT BETA HCG BLOOD, ED (MC, WL, AP ONLY): I-stat hCG, quantitative: <5 m[IU]/mL (ref <5)

## 2020-11-26 LAB — PREPARE RBC (CROSSMATCH)

## 2020-11-26 LAB — TROPONIN I (HIGH SENSITIVITY)
Troponin I (High Sensitivity): <2 ng/L (ref <18)
Troponin I (High Sensitivity): 2 ng/L (ref <18)

## 2020-11-26 LAB — RESP PANEL BY RT-PCR (FLU A&B, COVID) ARPGX2
Influenza A by PCR: NEGATIVE
Influenza B by PCR: NEGATIVE
SARS Coronavirus 2 by RT PCR: POSITIVE — AB

## 2020-11-26 LAB — LIPASE, BLOOD: Lipase: 30 U/L (ref 11–51)

## 2020-11-26 LAB — SARS CORONAVIRUS 2 (TAT 6-24 HRS): SARS Coronavirus 2: POSITIVE — AB

## 2020-11-26 LAB — ABO/RH: ABO/RH(D): O POS

## 2020-11-26 MED ORDER — HYDROCOD POLST-CPM POLST ER 10-8 MG/5ML PO SUER: 5.0000 mL | Freq: Two times a day (BID) | ORAL | Status: DC | PRN

## 2020-11-26 MED ORDER — SODIUM CHLORIDE 0.9 % IV SOLN
10.0000 mL/h | Freq: Once | INTRAVENOUS | Status: AC
  Administered 2020-11-26: 999 mL/h via INTRAVENOUS

## 2020-11-26 MED ORDER — ACETAMINOPHEN 650 MG RE SUPP: 650.0000 mg | Freq: Four times a day (QID) | RECTAL | Status: DC | PRN

## 2020-11-26 MED ORDER — ENOXAPARIN SODIUM 40 MG/0.4ML IJ SOSY
40.0000 mg | PREFILLED_SYRINGE | INTRAMUSCULAR | Status: DC
  Administered 2020-11-27: 40 mg via SUBCUTANEOUS
  Filled 2020-11-26: qty 0.4

## 2020-11-26 MED ORDER — ACETAMINOPHEN 500 MG PO TABS
500.0000 mg | ORAL_TABLET | Freq: Once | ORAL | Status: AC
  Administered 2020-11-26: 500 mg via ORAL
  Filled 2020-11-26: qty 1

## 2020-11-26 MED ORDER — ACETAMINOPHEN 325 MG PO TABS
650.0000 mg | ORAL_TABLET | Freq: Four times a day (QID) | ORAL | Status: DC | PRN
  Administered 2020-11-27: 650 mg via ORAL
  Filled 2020-11-26: qty 2

## 2020-11-26 MED ORDER — GUAIFENESIN-DM 100-10 MG/5ML PO SYRP: 10.0000 mL | ORAL_SOLUTION | ORAL | Status: DC | PRN

## 2020-11-26 NOTE — ED Triage Notes (Signed)
Pt presents with body aches, chest discomfort and headache x2 days. Took Nyquil with no relief

## 2020-11-26 NOTE — ED Notes (Signed)
Lab called for blood work and reports results should cross over in .

## 2020-11-26 NOTE — H&P (Signed)
Date: 11/26/2020               Patient Name:  Bailey Sloan MRN: [id number]  DOB: 1985-09-12 Age / Sex: 35 y.o., female   PCP: [facility]         Medical Service: Internal Medicine Teaching Service         Attending Physician: Dr. Charissa Bash, MD    First Contact: Dr. Karsten Ro, MD Pager: [phone]  Second Contact: Dr. Dolan Amen, MD Pager: [phone]       After Hours (After 5p/  First Contact Pager: [phone]  weekends / holidays): Second Contact Pager: [phone]   Chief Complaint: Chest tightness  History of Present Illness: Bailey Sloan is a 35 year old woman with a prior history of morbid obesity status post gastric sleeve in 2016, anemia secondary to menorrhagia, and hypertension who presented for evaluation of chest tightness.  Patient reports that she was in her normal state of health until Wednesday (five days prior to admission). 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 The following day, she began to have diarrhea and a cough productive of green mucus. Her symptoms continued to progress to where she began to experience chest tightness with coughing. She states that her symptoms were at their worst this morning to where she felt as if she was "breathing through a straw" so she presented to the emergency department for evaluation. She denies any known sick contacts at home or at work. She has been vaccinated with a booster, however this was completed approximately one year ago.   Additionally, patient has a history of anemia attributed to menorrhagia for which she follows with OBGYN and recently started a depo-provera injection on May 1st. She does not have a primary care physician. Her last menstrual period was last month, however she is unsure of the date. She states that her periods last approximately seven days and are very  heavy with occasional passing of blood clots. She uses many pads in a day, however she is unable to estimate the quantity. She reports being told that she has been anemic, however she has never required a blood transfusion and does not take any supplements including iron or vitamin B12. She denies any dark stools, blood in bowel movements or other sources of bleeding. She states that she has been experiencing some dizziness and lightheadedness with exertion, although this has been ongoing for several years.  ED Course: On arrival to the ED, patient hemodynamically stable, saturating well on room air. EKG with normal sinus rhythm. Initial labs with CBC revealing hemoglobin 6.7, MCV 63.4, RDW 21.3; BMP unremarkable; troponin <2 then 2; COVID positive; hepatic function panel unremarkable; lipase 30; beta-hCG <5'. CXR without acute cardiopulmonary process. Patient received 1 unit of packed red blood cells, 500mg  acetaminophen, robitussin and tussionex. IMTS consulted for admission.  Medications: Current Meds  Medication Sig  . amLODipine (NORVASC) 5 MG tablet Take 1 tablet (5 mg total) by mouth daily.  . medroxyPROGESTERone Acetate 150 MG/ML SUSY Inject 150 mg into the muscle every 3 (three) months.   Allergies: Allergies as of 11/26/2020  . (No Known Allergies)   History reviewed. No pertinent past medical history.  Family History: Mom, living - hypertension  Father, deceased - gunshot wound Children, living - healthy with no known medical conditions  Social History:  Patient lives in [place]  with her mother, stepfather, two sons and daughter. She has a fiance, however she does not live with him at this time. She works as a Engineer, civil (consulting) at [employer] and also as a Conservation officer, nature at [employer]. She follows with an OBGYN, however she does not have a primary care physician. She smokes approximately seven to nine cigarettes per day but denies any recreational drug use or alcohol consumption.  Review of  Systems: A complete ROS was negative except as per HPI.  Physical Exam: Blood pressure (!) 130/92, pulse 89, temperature 98.6 F (37 C), temperature source Oral, resp. rate 19, SpO2 100 %. Physical Exam Vitals and nursing note reviewed. Exam conducted with a chaperone present.  Constitutional:      Comments: Very pleasant and comfortable-appearing woman supine in ED stretcher in no acute distress. Patient able to participate in history and physical examination without difficulty and occassionally coughing during interaction  Eyes:     Extraocular Movements: Extraocular movements intact.     Conjunctiva/sclera: Conjunctivae normal.  Cardiovascular:     Rate and Rhythm: Normal rate and regular rhythm.     Pulses: Normal pulses.     Comments: 2/6 systolic murmur at the left upper sternal border Pulmonary:     Comments: Breathing comfortably on room air with mask overlying face. No respiratory distress, wheezes or rales. Abdominal:     General: Abdomen is flat. Bowel sounds are normal.     Palpations: Abdomen is soft.     Tenderness: There is no abdominal tenderness.  Musculoskeletal:     Cervical back: Normal range of motion and neck supple.     Right lower leg: No edema.     Left lower leg: No edema.  Skin:    General: Skin is warm and dry.     Capillary Refill: Capillary refill takes less than 2 seconds.     Coloration: Skin is not pale.  Neurological:     General: No focal deficit present.     Mental Status: Mental status is at baseline.  Psychiatric:        Mood and Affect: Mood normal.        Behavior: Behavior normal.   EKG: personally reviewed my interpretation is normal sinus rhythm without ischemic changes.  CXR: personally reviewed my interpretation is no acute cardiopulmonary process.  Assessment & Plan by Problem: Active Problems:   Symptomatic anemia  Bailey Sloan is a 35 year old woman with a prior history of morbid obesity status post gastric sleeve in 2016,  anemia secondary to menorrhagia, and hypertension who presented for evaluation of chest tightness found to have symptomatic anemia as well as positive for COVID-19.  #Symptomatic anemia In addition to patient's acute concerns, she has endorsed a chronic history of lightheadedness and dizziness particularly with exertion at work. Review of medical records shows that patient has had a progressive worsening anemia over the past few years with hemoglobin of 7.8 in June of 2021. Patient has established with OBGYN due to menorrhagia and recently started Depo injection two weeks prior to admission. Source of patient's microcytic anemia is likely iron deficiency anemia secondary to her menorrhagia although associated vitamin B12 deficiency remains a consideration given her history of gastric sleeve procedure in 2016. -Patient receiving 1 unit of packed red blood cells ordered by ED physician -Follow-up post transfusion hemoglobin -Vitamin B12 in morning -Iron, TIBC and ferritin in morning -Orthostatic vital signs  #COVID-19 Patient presenting with five days of generalized malaise, myalgias, cough, subjective fevers, diarrhea (  resolved), and chest tightness. Although patient vaccinated with booster, she is positive for COVID-19. Although she has no known exposure, she is at risk of exposure due to her two jobs and large family at home. Patient is saturating at 100% on room air throughout our interaction. -Monitor SpO2 -CRP, CBC with Diff, CMP, D-dimer, Ferritin, LDH in morning -Robitussin DM every four hours as needed -Tussionex every twelve hours as needed -Acetaminophen 650mg  every six hours for mild pain or fever -Cardiac monitoring -Incentive spirometry and flutter valve -Airborne and contact precautions  #Hypertension, chronic Patient has been prescribed amlodipine due to repeat elevated blood pressure readings upon presentation to her OBGYN's office. She reports that she has not been taking this  medication at home. Will hold this medication for now. -Hold home amlodipine 5mg  daily  #Health maintenance Patient does not have a primary care physician and would benefit from establishing care. -Establish with PCP prior to discharge.  #Code status: Full code #VTE ppx: Enoxaparin 40mg  daily #Diet: Regular #PT/OT: Not indicated #Bowel regimen: Not indicated #IVF: None  Dispo: Admit patient to Observation with expected length of stay less than 2 midnights.  Signed: , MD 11/26/2020, 8:36 PM  Pager: [phone] After 5pm on weekdays and 1pm on weekends: On Call pager: [phone]

## 2020-11-26 NOTE — ED Provider Notes (Signed)
[facility] EMERGENCY DEPARTMENT Provider Note   CSN: [id number] Arrival date & time: 11/26/20  1334     History Chief Complaint  Patient presents with  . Generalized Body Aches    Bailey Sloan is a 35 y.o. female.  The history is provided by the patient and medical records. No language interpreter was used.  Illness Associated symptoms: abdominal pain, chest pain, congestion, cough, diarrhea, fatigue, fever, headaches, myalgias, nausea, rhinorrhea and shortness of breath   Associated symptoms: no loss of consciousness, no vomiting and no wheezing   Shortness of Breath Severity:  Severe Onset quality:  Gradual Duration:  3 days Timing:  Constant Progression:  Waxing and waning Chronicity:  New Context: URI   Relieved by:  Nothing Worsened by:  Exertion Ineffective treatments:  None tried Associated symptoms: abdominal pain, chest pain, cough, fever, headaches and sputum production   Associated symptoms: no diaphoresis, no vomiting and no wheezing        History reviewed. No pertinent past medical history.  There are no problems to display for this patient.   Past Surgical History:  Procedure Laterality Date  . BREAST SURGERY    . LAPAROSCOPIC GASTRIC SLEEVE RESECTION       OB History   No obstetric history on file.     Family History  Problem Relation Age of Onset  . Healthy Mother     Social History   Tobacco Use  . Smoking status: Current Every Day Smoker    Packs/day: 0.25    Types: Cigarettes  . Smokeless tobacco: Never Used  Vaping Use  . Vaping Use: Never used  Substance Use Topics  . Alcohol use: Yes    Comment: occasionally  . Drug use: Never    Home Medications Prior to Admission medications   Medication Sig Start Date End Date Taking? Authorizing Provider  acetaminophen (TYLENOL) 500 MG tablet Take 1 tablet (500 mg total) by mouth every 6 (six) hours as needed. Patient not taking: Reported on 12/29/2019 03/14/19    Michela Pitcher A, PA-C  amLODipine (NORVASC) 5 MG tablet Take 1 tablet (5 mg total) by mouth daily. 12/29/19 01/28/20  Curatolo, Adam, DO  cephALEXin (KEFLEX) 500 MG capsule Take 1 capsule (500 mg total) by mouth 2 (two) times daily. 12/29/19   Curatolo, Adam, DO  cyclobenzaprine (FLEXERIL) 10 MG tablet Take 1 tablet (10 mg total) by mouth 2 (two) times daily as needed for muscle spasms. Patient not taking: Reported on 12/29/2019 03/14/19   Michela Pitcher A, PA-C  HYDROcodone-acetaminophen (NORCO/VICODIN) 5-325 MG tablet Take 1 tablet by mouth every 4 (four) hours as needed for severe pain. Patient not taking: Reported on 12/29/2019 03/14/19   Michela Pitcher A, PA-C  ibuprofen (ADVIL) 600 MG tablet Take 1 tablet (600 mg total) by mouth every 6 (six) hours as needed. Patient not taking: Reported on 12/29/2019 05/17/19   Lorre Nick, MD  Ibuprofen-diphenhydrAMINE Cit (MOTRIN PM) 200-38 MG TABS Take 2.5 tablets by mouth as needed (for migraines).    [provider]    Allergies    Patient has no known allergies.  Review of Systems   Review of Systems  Constitutional: Positive for chills, fatigue and fever. Negative for diaphoresis.  HENT: Positive for congestion and rhinorrhea.   Eyes: Negative for visual disturbance.  Respiratory: Positive for cough, sputum production, chest tightness and shortness of breath. Negative for wheezing.   Cardiovascular: Positive for chest pain and leg swelling. Negative for palpitations.  Gastrointestinal:  Positive for abdominal pain, diarrhea and nausea. Negative for constipation and vomiting.  Genitourinary: Negative for dysuria and flank pain.  Musculoskeletal: Positive for myalgias. Negative for back pain.  Neurological: Positive for dizziness, light-headedness and headaches. Negative for seizures, loss of consciousness, speech difficulty and numbness.  Psychiatric/Behavioral: Negative for agitation.  All other systems reviewed and are negative.   Physical  Exam Updated Vital Signs BP 129/83 (BP Location: Right Arm)   Pulse 78   Temp 99.7 F (37.6 C) (Oral)   Resp 19   SpO2 100%   Physical Exam Vitals and nursing note reviewed.  Constitutional:      General: She is not in acute distress.    Appearance: She is well-developed. She is not ill-appearing, toxic-appearing or diaphoretic.  HENT:     Head: Normocephalic and atraumatic.     Nose: Congestion present.     Mouth/Throat:     Mouth: Mucous membranes are dry.     Pharynx: No oropharyngeal exudate or posterior oropharyngeal erythema.  Eyes:     Extraocular Movements: Extraocular movements intact.     Conjunctiva/sclera: Conjunctivae normal.     Pupils: Pupils are equal, round, and reactive to light.  Cardiovascular:     Rate and Rhythm: Normal rate and regular rhythm.     Heart sounds: No murmur heard.   Pulmonary:     Effort: Pulmonary effort is normal. No respiratory distress.     Breath sounds: Rhonchi present. No wheezing or rales.  Chest:     Chest wall: No tenderness.  Abdominal:     General: Abdomen is flat.     Palpations: Abdomen is soft.     Tenderness: There is no abdominal tenderness. There is no right CVA tenderness, left CVA tenderness, guarding or rebound.  Musculoskeletal:        General: No tenderness.     Cervical back: Neck supple. No tenderness.     Right lower leg: No edema.     Left lower leg: Edema present.  Skin:    General: Skin is warm and dry.     Capillary Refill: Capillary refill takes less than 2 seconds.     Coloration: Skin is not pale.     Findings: No erythema.  Neurological:     General: No focal deficit present.     Mental Status: She is alert.  Psychiatric:        Mood and Affect: Mood normal.     ED Results / Procedures / Treatments   Labs (all labs ordered are listed, but only abnormal results are displayed) Labs Reviewed  RESP PANEL BY RT-PCR (FLU A&B, COVID) ARPGX2 - Abnormal; Notable for the following components:       Result Value   SARS Coronavirus 2 by RT PCR POSITIVE (*)    All other components within normal limits  BASIC METABOLIC PANEL - Abnormal; Notable for the following components:   CO2 21 (*)    Calcium 8.4 (*)    All other components within normal limits  CBC - Abnormal; Notable for the following components:   RBC 3.80 (*)    Hemoglobin 6.7 (*)    HCT 24.1 (*)    MCV 63.4 (*)    MCH 17.6 (*)    MCHC 27.8 (*)    RDW 21.3 (*)    All other components within normal limits  HEPATIC FUNCTION PANEL - Abnormal; Notable for the following components:   Albumin 3.4 (*)    AST 12 (*)  All other components within normal limits  SARS CORONAVIRUS 2 (TAT 6-24 HRS)  LIPASE, BLOOD  IRON AND TIBC  HIV ANTIBODY (ROUTINE TESTING W REFLEX)  D-DIMER, QUANTITATIVE  FERRITIN  LACTATE DEHYDROGENASE  C-REACTIVE PROTEIN  I-STAT BETA HCG BLOOD, ED ([facility], [facility], [facility] ONLY)  PREPARE RBC (CROSSMATCH)  TYPE AND SCREEN  ABO/RH  TROPONIN I (HIGH SENSITIVITY)  TROPONIN I (HIGH SENSITIVITY)    EKG EKG Interpretation  Date/Time:  Sunday Nov 26 2020 20:21:16 EDT Ventricular Rate:  79 PR Interval:  151 QRS Duration: 90 QT Interval:  370 QTC Calculation: 425 R Axis:   67 Text Interpretation: Sinus rhythm When compared to prior, similar appearance. No STEMI Confirmed by Theda Belfast ([id number]) on 11/26/2020 8:28:35 PM   Radiology DG Chest 2 View  Result Date: 11/26/2020 CLINICAL DATA:  Chest pain, body aches, discomfort EXAM: CHEST - 2 VIEW COMPARISON:  CT chest, 03/14/2019 FINDINGS: The heart size is normal. 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 Both lungs are clear. The visualized skeletal structures are unremarkable. IMPRESSION: 1. No acute abnormality of the lungs. 2. 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 Electronically Signed   By: Lauralyn Primes M.D.   On: 11/26/2020 14:52     Procedures Procedures   CRITICAL CARE Performed by: Canary Brim Udell Mazzocco Total critical care time: 35 minutes Critical care time was exclusive of separately billable procedures and treating other patients. Critical care was necessary to treat or prevent imminent or life-threatening deterioration. Critical care was time spent personally by me on the following activities: development of treatment plan with patient and/or surrogate as well as nursing, discussions with consultants, evaluation of patient's response to treatment, examination of patient, obtaining history from patient or surrogate, ordering and performing treatments and interventions, ordering and review of laboratory studies, ordering and review of radiographic studies, pulse oximetry and re-evaluation of patient's condition.   Medications Ordered in ED Medications  0.9 %  sodium chloride infusion (has no administration in time range)  guaiFENesin-dextromethorphan (ROBITUSSIN DM) 100-10 MG/5ML syrup 10 mL (has no administration in time range)  chlorpheniramine-HYDROcodone (TUSSIONEX) 10-8 MG/5ML suspension 5 mL (has no administration in time range)  acetaminophen (TYLENOL) tablet 500 mg (500 mg Oral Given 11/26/20 1848)    ED Course  I have reviewed the triage vital signs and the nursing notes.  Pertinent labs & imaging results that were available during my care of the patient were reviewed by me and considered in my medical decision making (see chart for details).    MDM Rules/Calculators/A&P                          Bailey Sloan is a 35 y.o. female with a past medical history significant for prior laparoscopic gastric sleeve resection and previous anemia due to menstrual cycles who presents with several days of fatigue, myalgias, malaise, subjective fever/chills, mild headache, chest pain, shortness of breath, productive cough, improved diarrhea, nausea, and lightheadedness.  Patient reports that her symptoms well been  worsening over the last several days.  She does report has had some mild edema for the last few weeks in her legs.  She reports she has been more tired but since she started having some fevers and chills and cough everything has worsened.  She reports she is vaccinated against COVID-19 but has not had COVID during the pandemic.  She reports some nausea  but no vomiting.  She reports some diarrhea that has improved.  No blood in her stool.  She reports a productive cough with some phlegm but no hemoptysis.  She reports some chest tightness especially when she is trying to do anything but denies any chest pain at this time.  She reports that has improved.  She reports feeling lightheaded but has not had any syncopal episodes.  Denies any focal neurologic complaints otherwise.  On exam, lungs have some coarseness at the bases but otherwise clear.  Chest is nontender.  Abdomen is nontender.  Good pulses in extremities.  Legs have some mild edema which she reports has been worsening over the last few weeks.  Patient is warm to the touch but is technically afebrile on arrival.  She is not tachycardic or tachypneic.  Patient reports that she recently got started back on her Depo to prevent vaginal bleeding during menstrual cycles as this has caused her to be anemic in the past.  She reports her last menstrual cycle was last month.  Patient had some screening lab work done in triage before arrival to the exam room.  Patient does have worsening hemoglobin of 6.7.  I suspect a component of symptomatic anemia because any chest tightness, shortness of breath, fatigue, lightheadedness.  I suspect this is related to her recent menstrual cycles and vaginal bleeding and now that she is on the Depo shot, hopefully this will improve.  She denied any rectal bleeding and we discussed doing a fecal occult that she deferred it given the likely vaginal source of her anemia.  We will get a COVID and influenza test as she will need  admission and she is having URI symptoms.  Initially she had a 6 to 24-hour COVID only swab performed but as she will need admission for the symptomatic anemia, shortness of breath, chest tightness, and lightheadedness, will get the faster test as well.  We will get other screening labs including hepatic function and lipase given the nausea and upper abdominal discomfort.  We will get other work-up as well.  Anticipate reassessment of admission after work-up is completed.  Patient is indeed COVID-positive.  Patient remitted for chest pain and shortness of breath with symptomatic anemia as well as COVID 19 infection.  Medicine team called for admission.  Final Clinical Impression(s) / ED Diagnoses Final diagnoses:  Symptomatic anemia  COVID  Chest pain, unspecified type    Rx / DC Orders ED Discharge Orders    None     Clinical Impression: 1. Symptomatic anemia   2. COVID   3. Chest pain, unspecified type     Disposition: Admit  This note was prepared with assistance of Dragon voice recognition software. Occasional wrong-word or sound-a-like substitutions may have occurred due to the inherent limitations of voice recognition software.     Myer Bohlman, Canary Brimhristopher J, MD 11/26/20 2028

## 2020-11-26 NOTE — ED Notes (Signed)
Pt complained of HA priior to blood administration

## 2020-11-27 ENCOUNTER — Telehealth: Payer: Self-pay | Admitting: Internal Medicine

## 2020-11-27 LAB — COMPREHENSIVE METABOLIC PANEL
ALT: 10 U/L (ref 0–44)
AST: 13 U/L — ABNORMAL LOW (ref 15–41)
Albumin: 3.2 g/dL — ABNORMAL LOW (ref 3.5–5.0)
Alkaline Phosphatase: 46 U/L (ref 38–126)
Anion gap: 5 (ref 5–15)
BUN: 12 mg/dL (ref 6–20)
CO2: 24 mmol/L (ref 22–32)
Calcium: 8.4 mg/dL — ABNORMAL LOW (ref 8.9–10.3)
Chloride: 110 mmol/L (ref 98–111)
Creatinine, Ser: 0.68 mg/dL (ref 0.44–1.00)
GFR, Estimated: >60 mL/min (ref >60)
Glucose, Bld: 89 mg/dL (ref 70–99)
Potassium: 3.4 mmol/L — ABNORMAL LOW (ref 3.5–5.1)
Sodium: 139 mmol/L (ref 135–145)
Total Bilirubin: 0.6 mg/dL (ref 0.3–1.2)
Total Protein: 6.3 g/dL — ABNORMAL LOW (ref 6.5–8.1)

## 2020-11-27 LAB — BPAM RBC
Blood Product Expiration Date: 202206062359
ISSUE DATE / TIME: 202205151815
Unit Type and Rh: 5100

## 2020-11-27 LAB — HIV ANTIBODY (ROUTINE TESTING W REFLEX)
HIV Screen 4th Generation wRfx: NONREACTIVE
HIV Screen 4th Generation wRfx: NONREACTIVE

## 2020-11-27 LAB — CBC WITH DIFFERENTIAL/PLATELET
Abs Immature Granulocytes: 0.01 10*3/uL (ref 0.00–0.07)
Basophils Absolute: 0 10*3/uL (ref 0.0–0.1)
Basophils Relative: 0 %
Eosinophils Absolute: 0 10*3/uL (ref 0.0–0.5)
Eosinophils Relative: 1 %
HCT: 25 % — ABNORMAL LOW (ref 36.0–46.0)
Hemoglobin: 7.3 g/dL — ABNORMAL LOW (ref 12.0–15.0)
Immature Granulocytes: 0 %
Lymphocytes Relative: 42 %
Lymphs Abs: 1.8 10*3/uL (ref 0.7–4.0)
MCH: 19.2 pg — ABNORMAL LOW (ref 26.0–34.0)
MCHC: 29.2 g/dL — ABNORMAL LOW (ref 30.0–36.0)
MCV: 65.6 fL — ABNORMAL LOW (ref 80.0–100.0)
Monocytes Absolute: 0.6 10*3/uL (ref 0.1–1.0)
Monocytes Relative: 14 %
Neutro Abs: 1.8 10*3/uL (ref 1.7–7.7)
Neutrophils Relative %: 43 %
Platelets: 248 10*3/uL (ref 150–400)
RBC: 3.81 MIL/uL — ABNORMAL LOW (ref 3.87–5.11)
RDW: 23.6 % — ABNORMAL HIGH (ref 11.5–15.5)
WBC: 4.2 10*3/uL (ref 4.0–10.5)
nRBC: 0 % (ref 0.0–0.2)

## 2020-11-27 LAB — FERRITIN
Ferritin: 3 ng/mL — ABNORMAL LOW (ref 11–307)
Ferritin: 3 ng/mL — ABNORMAL LOW (ref 11–307)

## 2020-11-27 LAB — IRON AND TIBC
Iron: 27 ug/dL — ABNORMAL LOW (ref 28–170)
Iron: 15 ug/dL — ABNORMAL LOW (ref 28–170)
Saturation Ratios: 6 % — ABNORMAL LOW (ref 10.4–31.8)
Saturation Ratios: 4 % — ABNORMAL LOW (ref 10.4–31.8)
TIBC: 428 ug/dL (ref 250–450)
TIBC: 412 ug/dL (ref 250–450)
UIBC: 401 ug/dL
UIBC: 397 ug/dL

## 2020-11-27 LAB — VITAMIN D 25 HYDROXY (VIT D DEFICIENCY, FRACTURES): Vit D, 25-Hydroxy: 35.46 ng/mL (ref 30–100)

## 2020-11-27 LAB — TYPE AND SCREEN
ABO/RH(D): O POS
Antibody Screen: NEGATIVE
Unit division: 0

## 2020-11-27 LAB — C-REACTIVE PROTEIN
CRP: 0.6 mg/dL (ref <1.0)
CRP: 0.5 mg/dL (ref <1.0)

## 2020-11-27 LAB — D-DIMER, QUANTITATIVE
D-Dimer, Quant: 0.29 ug/mL-FEU (ref 0.00–0.50)
D-Dimer, Quant: 0.32 ug/mL-FEU (ref 0.00–0.50)

## 2020-11-27 LAB — LACTATE DEHYDROGENASE: LDH: 106 U/L (ref 98–192)

## 2020-11-27 LAB — VITAMIN B12: Vitamin B-12: 153 pg/mL — ABNORMAL LOW (ref 180–914)

## 2020-11-27 LAB — MAGNESIUM: Magnesium: 2 mg/dL (ref 1.7–2.4)

## 2020-11-27 MED ORDER — CYANOCOBALAMIN 100 MCG PO TABS: 100.0000 ug | ORAL_TABLET | Freq: Every day | ORAL | 0 refills | Status: AC

## 2020-11-27 MED ORDER — FERROUS SULFATE 325 (65 FE) MG PO TABS: 325.0000 mg | ORAL_TABLET | ORAL | 0 refills | Status: AC

## 2020-11-27 MED ORDER — PANTOPRAZOLE SODIUM 40 MG PO TBEC: 40.0000 mg | DELAYED_RELEASE_TABLET | Freq: Every day | ORAL | Status: AC

## 2020-11-27 MED ORDER — HYDROCOD POLST-CPM POLST ER 10-8 MG/5ML PO SUER: 5.0000 mL | Freq: Two times a day (BID) | ORAL | 0 refills | Status: AC | PRN

## 2020-11-27 MED ORDER — GUAIFENESIN-DM 100-10 MG/5ML PO SYRP: 10.0000 mL | ORAL_SOLUTION | ORAL | 0 refills | Status: AC | PRN

## 2020-11-27 MED ORDER — SODIUM CHLORIDE 0.9 % IV SOLN
510.0000 mg | Freq: Once | INTRAVENOUS | Status: AC
  Administered 2020-11-27: 510 mg via INTRAVENOUS
  Filled 2020-11-27: qty 17

## 2020-11-27 MED ORDER — POTASSIUM CHLORIDE 20 MEQ PO PACK
20.0000 meq | PACK | Freq: Once | ORAL | Status: AC
  Administered 2020-11-27: 20 meq via ORAL
  Filled 2020-11-27: qty 1

## 2020-11-27 MED ORDER — PANTOPRAZOLE SODIUM 40 MG PO TBEC
40.0000 mg | DELAYED_RELEASE_TABLET | Freq: Every day | ORAL | Status: DC
  Administered 2020-11-27: 40 mg via ORAL
  Filled 2020-11-27: qty 1

## 2020-11-27 MED ORDER — VITAMIN B-12 100 MCG PO TABS
100.0000 ug | ORAL_TABLET | Freq: Every day | ORAL | Status: DC
  Administered 2020-11-27: 100 ug via ORAL
  Filled 2020-11-27: qty 1

## 2020-11-27 NOTE — Discharge Summary (Addendum)
Name: Bailey Sloan MRN: [id number] DOB: Nov 15, 1985 35 y.o. PCP: [facility]  Date of Admission: 11/26/2020  1:37 PM Date of Discharge:  11/27/2020 Attending Physician: Miguel Aschoff, MD   Discharge Diagnosis: 1. Iron Deficiency anemia  2. Vitamin B12 deficiency 3. COVID 19 infection 4. Hypertention  Discharge Medications: Allergies as of 11/27/2020   No Known Allergies     Medication List    STOP taking these medications   acetaminophen 500 MG tablet Commonly known as: TYLENOL   amLODipine 5 MG tablet Commonly known as: NORVASC   cephALEXin 500 MG capsule Commonly known as: KEFLEX   cyclobenzaprine 10 MG tablet Commonly known as: FLEXERIL   HYDROcodone-acetaminophen 5-325 MG tablet Commonly known as: NORCO/VICODIN   ibuprofen 600 MG tablet Commonly known as: ADVIL   Motrin PM 200-38 MG Tabs Generic drug: Ibuprofen-diphenhydrAMINE Cit     TAKE these medications   chlorpheniramine-HYDROcodone 10-8 MG/5ML Suer Commonly known as: TUSSIONEX Take 5 mLs by mouth every 12 (twelve) hours as needed for cough.   cyanocobalamin 100 MCG tablet Take 1 tablet (100 mcg total) by mouth daily.   ferrous sulfate 325 (65 FE) MG tablet Take 1 tablet (325 mg total) by mouth every other day for 30 doses.   guaiFENesin-dextromethorphan 100-10 MG/5ML syrup Commonly known as: ROBITUSSIN DM Take 10 mLs by mouth every 4 (four) hours as needed for cough.   medroxyPROGESTERone Acetate 150 MG/ML Susy Inject 150 mg into the muscle every 3 (three) months.   pantoprazole 40 MG tablet Commonly known as: PROTONIX Take 1 tablet (40 mg total) by mouth daily.       Disposition and follow-up:   [name] was discharged from [facility] in Stable condition.  At the hospital follow up visit please address:  1.  Follow up: Marland Kitchen Follow up in Internal medicine clinic on 5/26 at 1.15 pm. . Follow up with Gynecologist for menorrhagia and Depo  provera injections.   2.  Labs / imaging needed at time of follow-up: CBC  3.  Pending labs/ test needing follow-up: None  Follow-up Appointments:  Follow-up Information    Dolan Amen, MD. Schedule an appointment as soon as possible for a visit in 1 week(s).   Specialty: Internal Medicine Contact information: [address] [place] [address] [phone]               Hospital Course by problem list: Bailey Sloan is a 35 y.o. female with hx of morbid obesity s/p gastric sleeve (2016), anemia secondary to menorrhagia, and  HTN who presented for evaluation of chest tightness found to have symptomatic anemia as well as positive for COVID 19 infection.   Iron deficiency anemia Vitamin B12 deficiency Patient presented with chest tightness and found to have hemoglobin of 6.7. Patient has a history of menorrhagia.  Patient follows with OB/GYN and recently started on Depo injection.  Last Depo injection was 2 weeks ago.  Patient received 1 unit of PRBC's.  Hemoglobin posttransfusion is 7.3.  Vitamin B12 low at 153.  Vitamin D normal.  Iron low at 27, ferritin low at 3, saturation ratio 6.  Etiology of anemia likely due to iron deficiency anemia secondary to menorrhagia and vitamin B12 deficiency due to gastric sleeve procedure in 2016. Pt was given 1 unit of  IV Feraheme and started on Vitamin B12 100 mcg daily. Pt is recommended to take Ferrous sulphate 325 mg every other day at discharge and continue to  take Vitamin B 12 orally. Pt is recommended to follow up at IM outpatient clinic for follow up as Pt doesn't have have PCP.   COVID -19 Patient had mild symptoms at admission with generalized malaise, myalgia, mild cough and chest tightness.  Patient was vaccinated with booster 1 year ago. Her vitals were stable. She was found to be positive for COVID-19. D-dimer, CRP and LDH wnl, ferritin low at 3. Chest x ray negative for acute abnormality.. Patient didn't need any  Oxygen during inpatient stay. Pt symptoms were managed with Robitussin-DM andTussionex for cough.  Hypertension, chronic  Patient has been prescribed amlodipine due to repeat elevated blood pressure readings upon presentation to her OBGYN's office. She has not been taking this medication at home. Her vitals stable since admission. Her home amlodipine was held. May restart at outpatient visit.  Health Maintenance Pt is recommended to follow up at IM outpatient clinic for follow up as Pt doesn't have have PCP.  Subjective on day of discharge: Patient states she is doing better.  States she does not have any chest tightness but has a mild cough with yellow phlegm.  Denies any shortness of breath, chest pain and dizziness.  Patient states she wants to go home.  Discussed her blood results and need for iron infusion today.  Patient agrees to stay and complete the iron infusion.  Explained that she can be discharged after iron infusion and can follow up with Im clinic as she doesn't have a PCP.   Discharge Exam:   BP 124/79 (BP Location: Right Arm)   Pulse 68   Temp 98.5 F (36.9 C) (Oral)   Resp 18   Wt 82.3 kg   SpO2 100%   BMI 32.14 kg/m  Discharge exam: Physical Exam Constitutional: Pleasant looking woman lying on bed.  Not in acute distress. Responding appropriately to questions.  HEENT- No icterus. Mild pallor  Cardiovascular: Regular rate and rhythm. No Murmur. Pulmonary: Breathing comfortably at room air with mask overlying face. No distress noted.  Chest clear to auscultation.  No wheezes, rales or rhonchi Abdominal: Flat, nondistended Skin: warm and dry Neurological: Awake, and alert Psychiatric: normal mood and affect  Pertinent Labs, Studies, and Procedures:  DG Chest 2 View  Result Date: 11/26/2020 CLINICAL DATA:  Chest pain, body aches, discomfort EXAM: CHEST - 2 VIEW COMPARISON:  CT chest, 03/14/2019 FINDINGS: The heart size is normal. 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 Both lungs are clear. The visualized skeletal structures are unremarkable. IMPRESSION: 1. No acute abnormality of the lungs. 2. 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 Electronically Signed   By: Lauralyn Primes M.D.   On: 11/26/2020 14:52   Discharge Instructions: Dear Bailey Sloan, Thank you for letting us participate in your care! In this section, you will find a brief hospital admission summary of why you were admitted to the hospital, what happened during your admission, your diagnosis/diagnoses, and recommended follow up.   You were admitted because you were experiencing chest tightness and fatigue   Your testing revealed symptomatic anemia and COVID 19 infection.   You were diagnosed with Iron deficiency anemia, Vitamin B12 deficiency and COVID 19 infection .  You were treated with Blood transfusion, Iv iron and oral Vitamin B12.   Your symptoms improved and you were discharged from the hospital for meeting this goal. Discharge Instructions    Diet - low sodium  heart healthy   Complete by: As directed    Increase activity slowly   Complete by: As directed     -Please Follow-up in internal medicine clinic on 5/26 at 1.15 pm. -Please Follow-up with gynecologist for management of menorrhagia and Depo-provera injections.   Signed:  Karsten Ro, MD 11/27/2020, 4:22 PM   Pager: [phone]

## 2020-11-27 NOTE — Telephone Encounter (Signed)
TOC NEW  HFU PER DR WINTERS TO SEE DR. Barbaraann Faster ON 12/07/2020 @ 1:15 PM

## 2020-11-27 NOTE — ED Notes (Signed)
Attempted report at this time.

## 2020-11-27 NOTE — Discharge Instructions (Signed)
Dear Bailey Sloan,   Thank you for letting us participate in your care! In this section, you will find a brief hospital admission summary of why you were admitted to the hospital, what happened during your admission, your diagnosis/diagnoses, and recommended follow up.   You were admitted because you were experiencing chest tightness and fatigue   Your testing revealed symptomatic anemia and COVID 19 infection.   You were diagnosed with Iron deficiency anemia, oral Vitamin B12 deficiency and COVID 19 infection .  You were treated with Blood transfusion, Iv iron and Vitamin B12.   Your symptoms improved and you were discharged from the hospital for meeting this goal.    POST-HOSPITAL & CARE INSTRUCTIONS 1. Please follow up with Internal medicine clinic on 5/26 at 1.15 pm. 2. Follow up with Gynecologist for menorrhagia and Depo Provera injections.  3. Please take your medications as mentioned in discharge. 4. Please let PCP/Specialists know of any changes in medications that were made.  5. Please see medications section of this packet for any medication changes.   DOCTOR'S APPOINTMENTS & FOLLOW UP No future appointments. Follow up in Internal medicine clinic on 5/26 at 1.15 pm.  Thank you for choosing [facility]! Take care and be well!  Internal  Medicine Teaching Service Inpatient Team [place]  [facility]  [address] [place], [place] [address] [phone]

## 2020-11-27 NOTE — Plan of Care (Signed)
  Problem: Education: Goal: Knowledge of General Education information will improve Description Including pain rating scale, medication(s)/side effects and non-pharmacologic comfort measures Outcome: Progressing   

## 2020-12-04 NOTE — Telephone Encounter (Signed)
Transition Care Management Unsuccessful Follow-up Telephone Call  Date of discharge and from where:  Discharged 11/27/20 from the hospital.  Attempts:  1st Attempt  Reason for unsuccessful TCM follow-up call:  Left voice message

## 2020-12-05 NOTE — Telephone Encounter (Signed)
Transition Care Management Follow-up Telephone Call  Date of discharge and from where: Discharged  11/27/20 from the hospital.  How have you been since you were released from the hospital?  Stated she has been doing well. This is her first day back at work.  Any questions or concerns? No  Items Reviewed:  Did the pt receive and understand the discharge instructions provided? Yes   Medications obtained and verified? Yes  Stated one medication was too expensive; over $100 -she believes it was Tussionex, her insurance would not pay for it.  Any new allergies since your discharge? No   Dietary orders reviewed? Yes. Do you have support at home? Yes  - lives with her mother.  Home Care and Equipment/Supplies: Were home health services ordered? No.  Functional Questionnaire: (I = Independent and D = Dependent) ADLs: Independent of all ADL's.  Follow up appointments reviewed:   PCP Hospital f/u appt confirmed? Yes  Scheduled to see Dr Gwyneth Revels  on 12/07/20 @ 1345 PM.  Specialist Hospital f/u appt confirmed? N/A.  Are transportation arrangements needed? No Stated she has made arrangements.  If their condition worsens, is the pt aware to call PCP or go to the Emergency Dept.? Yes  Was the patient provided with contact information for the PCP's office or ED? Yes  Was to pt encouraged to call back with questions or concerns? Yes

## 2020-12-07 ENCOUNTER — Encounter: Payer: Medicaid Other | Admitting: Internal Medicine

## 2020-12-07 NOTE — Progress Notes (Deleted)
   CC: ***  HPI:  [name] is a 35 y.o. with a history of morbid obesity s/p gastric sleeve, anemia 2/2 menorrhagia, and hypertension presenting for a hospital follow up for IDA and Vit B12 deficiency, and to establish care.   Patient was hospitalized between ***  No past medical history on file. Review of Systems:  ***  Physical Exam:  There were no vitals filed for this visit. ***  Assessment & Plan:   See Encounters Tab for problem based charting.  Patient {GC/GE:3044014::"discussed with","seen with"} Dr. {NAMES:[id number]::"Butcher","Guilloud","Hoffman","Mullen","Narendra","Raines","Vincent"}

## 2020-12-10 NOTE — Assessment & Plan Note (Deleted)
This encounter is for hospital follow up of symptomatic iron deficient anemia in the setting of menorrhagia requiring one unit blood transfusion and ferraheme.   Plan -would favor rechecking an iron panel some around the middle to end of June as IV iron supplementation can interfere with the iron panel results -will check a CBC today

## 2020-12-10 NOTE — Progress Notes (Deleted)
   New Patient Office Visit  Subjective:  Patient ID: Bailey Sloan, female    DOB: [birth year]  Age: 35 y.o. MRN: [id number]  CC: No chief complaint on file.   HPI Bailey Sloan is a 35 year old female with a history of hypertension who presents today for establishment of care and hospital follow up.  Follow up Hospitalization  Patient was admitted to [facility] on 11/26/20 and discharged on 11/27/20. She was treated for symptomatic iron deficiency anemia in the setting of severe menorrhagia which she follows with OB for. She was additionally noted to have low Vit B12 (153).  Treatment for this included 1U PRBC and feraheme. She was discharged on oral iron and B12 supplementation.  She was additionally noted to have mildly symptomatic COVID 19 that admission but did not require supplemental oxygen.   -----------------------------------------------------------------------------------------   No past medical history on file.  Past Surgical History:  Procedure Laterality Date  . BREAST SURGERY    . LAPAROSCOPIC GASTRIC SLEEVE RESECTION      Family History  Problem Relation Age of Onset  . Healthy Mother     Social History   Socioeconomic History  . Marital status: Single    Spouse name: Not on file  . Number of children: Not on file  . Years of education: Not on file  . Highest education level: Not on file  Occupational History  . Not on file  Tobacco Use  . Smoking status: Current Every Day Smoker    Packs/day: 0.25    Types: Cigarettes  . Smokeless tobacco: Never Used  Vaping Use  . Vaping Use: Never used  Substance and Sexual Activity  . Alcohol use: Yes    Comment: occasionally  . Drug use: Never  . Sexual activity: Not on file  Other Topics Concern  . Not on file  Social History Narrative  . Not on file   Social Determinants of Health   Financial Resource Strain: Not on file  Food Insecurity: Not on file  Transportation Needs: Not on file  Physical  Activity: Not on file  Stress: Not on file  Social Connections: Not on file  Intimate Partner Violence: Not on file    ROS Review of Systems  Objective:   Today's Vitals: There were no vitals taken for this visit.  Physical Exam  Assessment & Plan:   Problem List Items Addressed This Visit   None     Outpatient Encounter Medications as of 12/14/2020  Medication Sig  . chlorpheniramine-HYDROcodone (TUSSIONEX) 10-8 MG/5ML SUER Take 5 mLs by mouth every 12 (twelve) hours as needed for cough.  . ferrous sulfate 325 (65 FE) MG tablet Take 1 tablet (325 mg total) by mouth every other day for 30 doses.  Marland Kitchen guaiFENesin-dextromethorphan (ROBITUSSIN DM) 100-10 MG/5ML syrup Take 10 mLs by mouth every 4 (four) hours as needed for cough.  . medroxyPROGESTERone Acetate 150 MG/ML SUSY Inject 150 mg into the muscle every 3 (three) months.  . pantoprazole (PROTONIX) 40 MG tablet Take 1 tablet (40 mg total) by mouth daily.  . vitamin B-12 100 MCG tablet Take 1 tablet (100 mcg total) by mouth daily.   No facility-administered encounter medications on file as of 12/14/2020.    Follow-up: No follow-ups on file.   Elige Radon, MD

## 2020-12-14 ENCOUNTER — Encounter: Payer: Medicaid Other | Admitting: Internal Medicine

## 2020-12-14 ENCOUNTER — Telehealth: Payer: Self-pay

## 2020-12-14 DIAGNOSIS — D649 Anemia, unspecified: Secondary | ICD-10-CM

## 2020-12-14 NOTE — Telephone Encounter (Signed)
Pt was called about her missed hospital F/u today  she rescheduled 6/14@3 :45 ... pt stated she forgot about the appt due to she went back to work

## 2020-12-26 ENCOUNTER — Encounter: Payer: Medicaid Other | Admitting: Internal Medicine

## 2020-12-30 ENCOUNTER — Other Ambulatory Visit: Payer: Self-pay | Admitting: Internal Medicine

## 2021-01-16 ENCOUNTER — Other Ambulatory Visit: Payer: Self-pay | Admitting: Internal Medicine

## 2021-01-21 ENCOUNTER — Other Ambulatory Visit: Payer: Self-pay | Admitting: Internal Medicine

## 2021-08-07 IMAGING — CT CT HEAD W/O CM
3 series · 15 of 47 positions shown, 18 images · non-contrast
Comparison: CT 03/14/2019

CLINICAL DATA: Headache syncopal event

EXAM:
CT HEAD WITHOUT CONTRAST
TECHNIQUE: Contiguous axial images were obtained from the base of the skull
through the vertex without intravenous contrast.

[Series 3: head 5.0 h30s · axial · 0.41mm/px · z∈[-144,-19]mm · 9 of 30 slices shown, 12 images]
[im 3/30  brain]
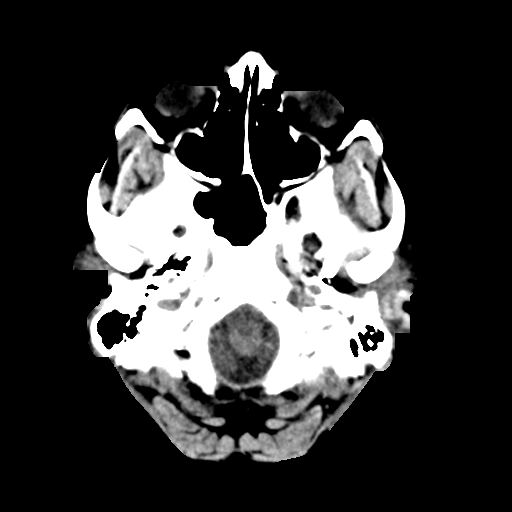
[im 3/30  bone]
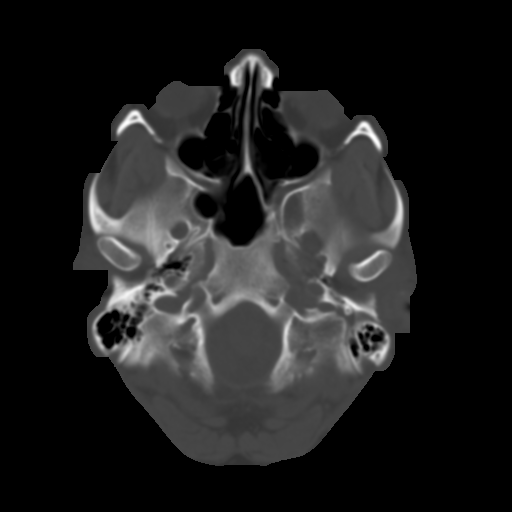
[im 6/30  brain]
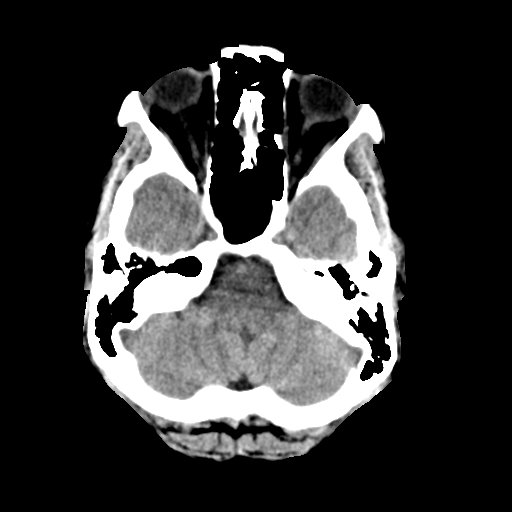
[im 9/30  brain]
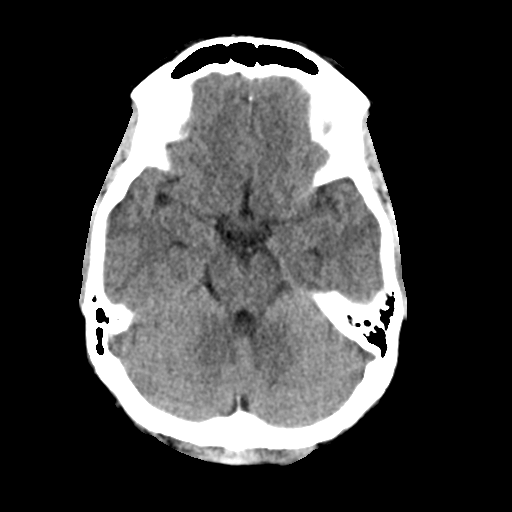
[im 12/30  brain]
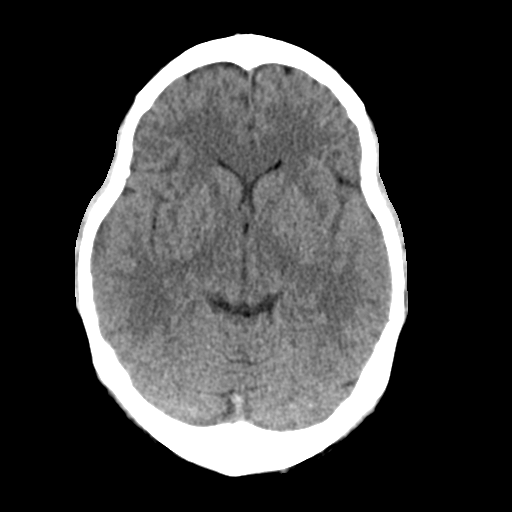
[im 16/30  brain]
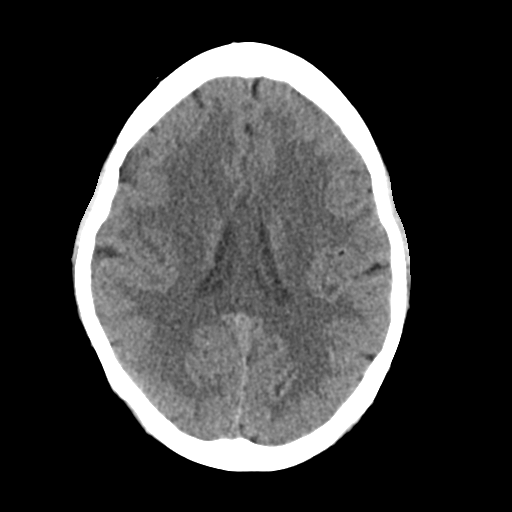
[im 16/30  bone]
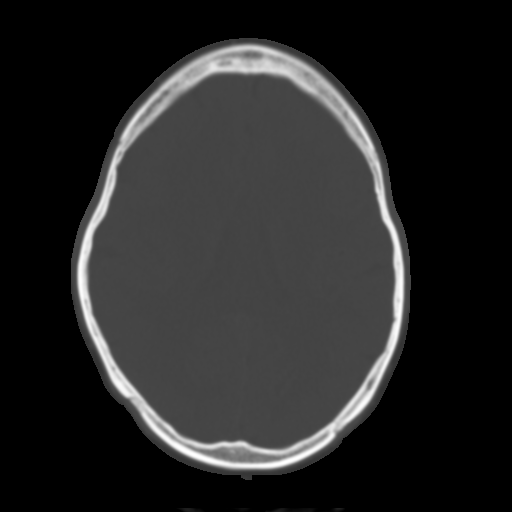
[im 19/30  brain]
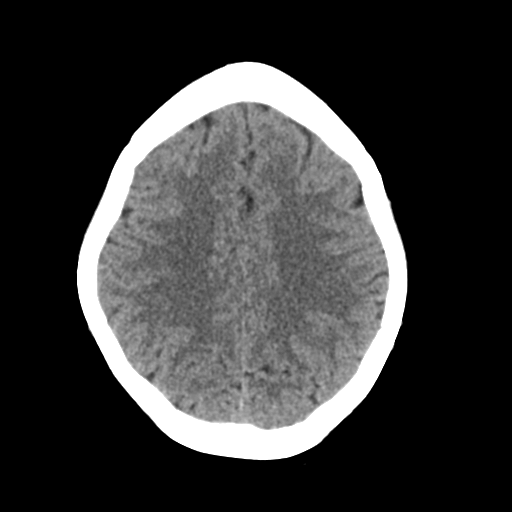
[im 22/30  brain]
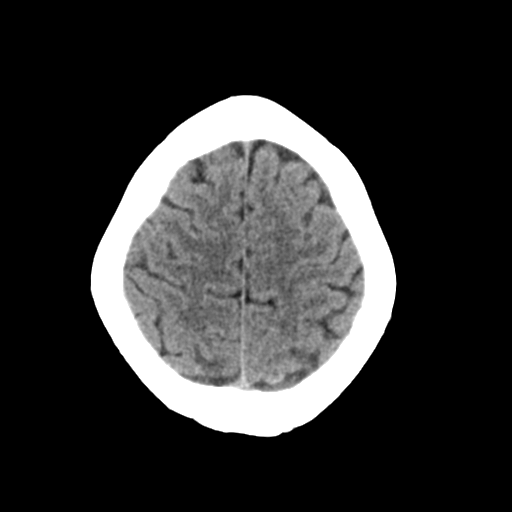
[im 25/30  brain]
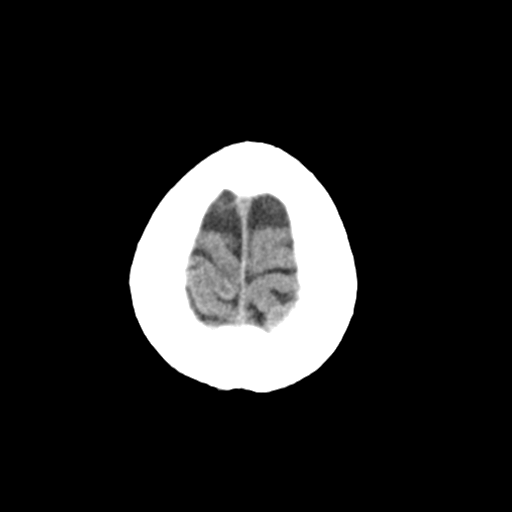
[im 28/30  brain]
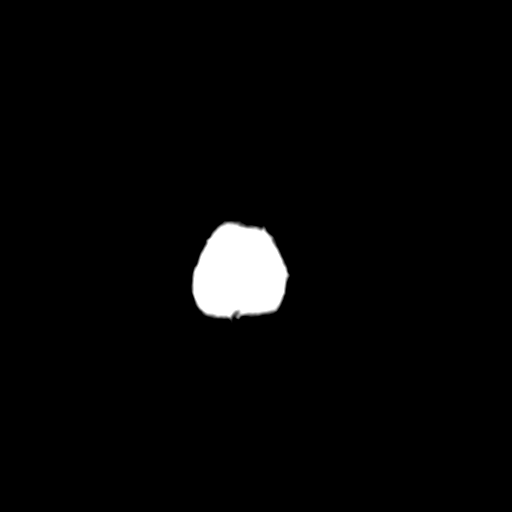
[im 28/30  bone]
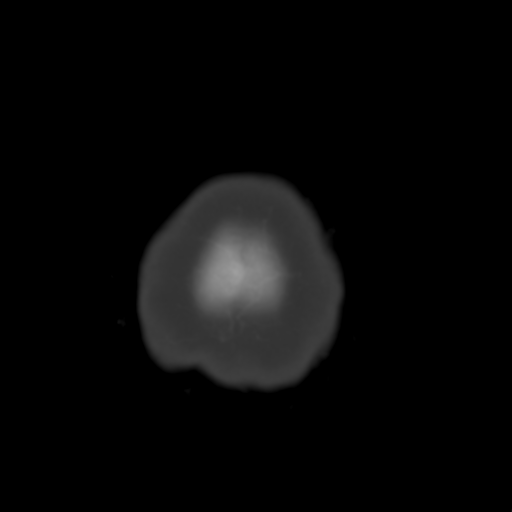

[Series 5: head 3.0 mpr cor · coronal · 0.28mm/px · 3 of 68 slices shown]
[im 23/68  brain]
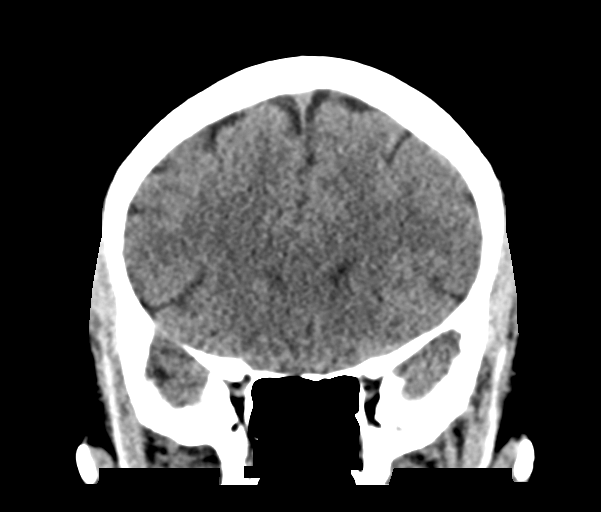
[im 30/68  brain]
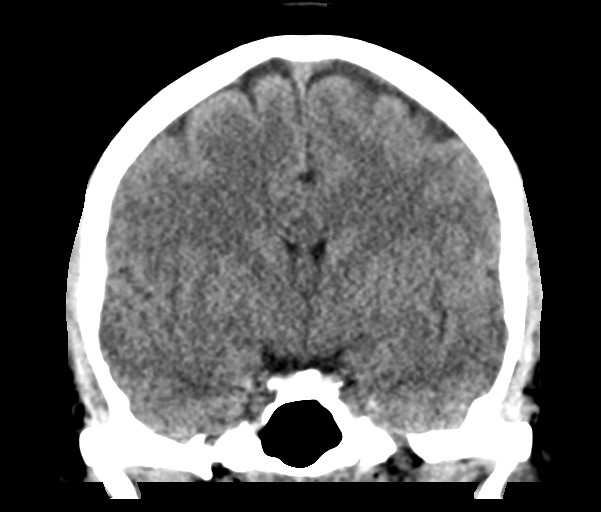
[im 38/68  brain]
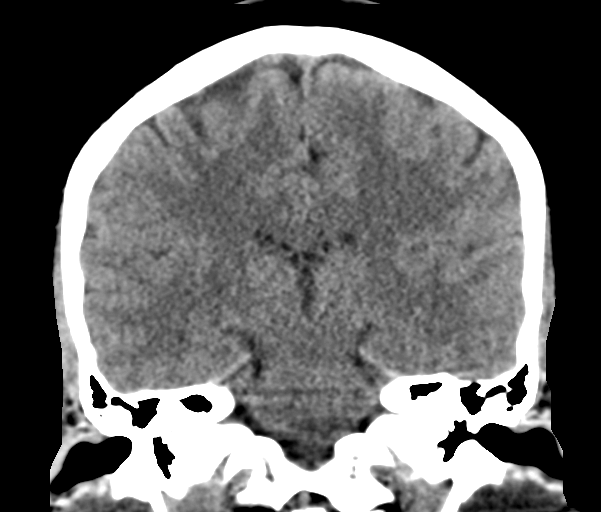

[Series 6: head 3.0 mpr sag · sagittal · 0.29mm/px · 3 of 62 slices shown]
[im 21/62  brain]
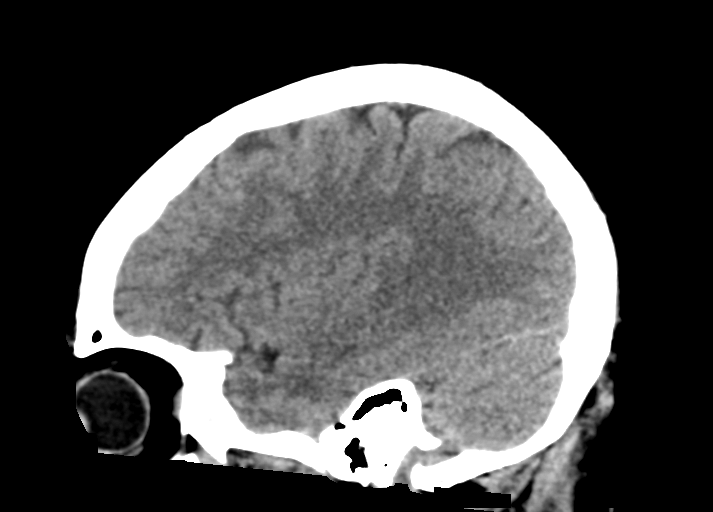
[im 31/62  brain]
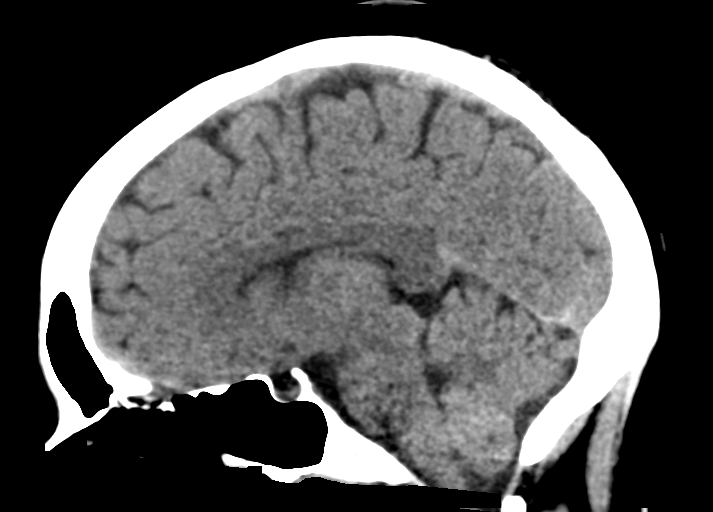
[im 41/62  brain]
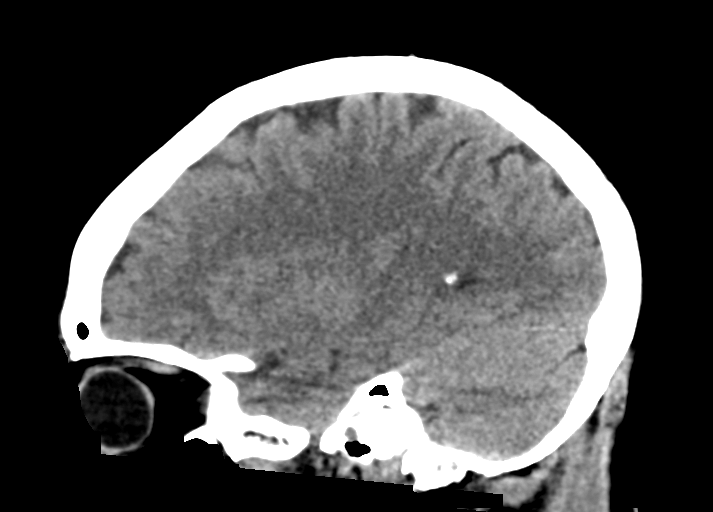

[15 of 47 positions shown; findings below may reference images not displayed]

FINDINGS: Brain: No evidence of acute infarction, hemorrhage, hydrocephalus,
extra-axial collection or mass lesion/mass effect.

Vascular: No hyperdense vessel or unexpected calcification.

Skull: Normal. Negative for fracture or focal lesion.

Sinuses/Orbits: Old fracture deformity medial wall left orbit.

Other: None
IMPRESSION: Negative non contrasted CT appearance of the brain

## 2022-07-06 IMAGING — CR DG CHEST 2V
2 series · 2 of 2 positions shown · non-contrast
Comparison: CT chest, 03/14/2019

CLINICAL DATA: Chest pain, body aches, discomfort

EXAM:
CHEST - 2 VIEW

[chest pa]
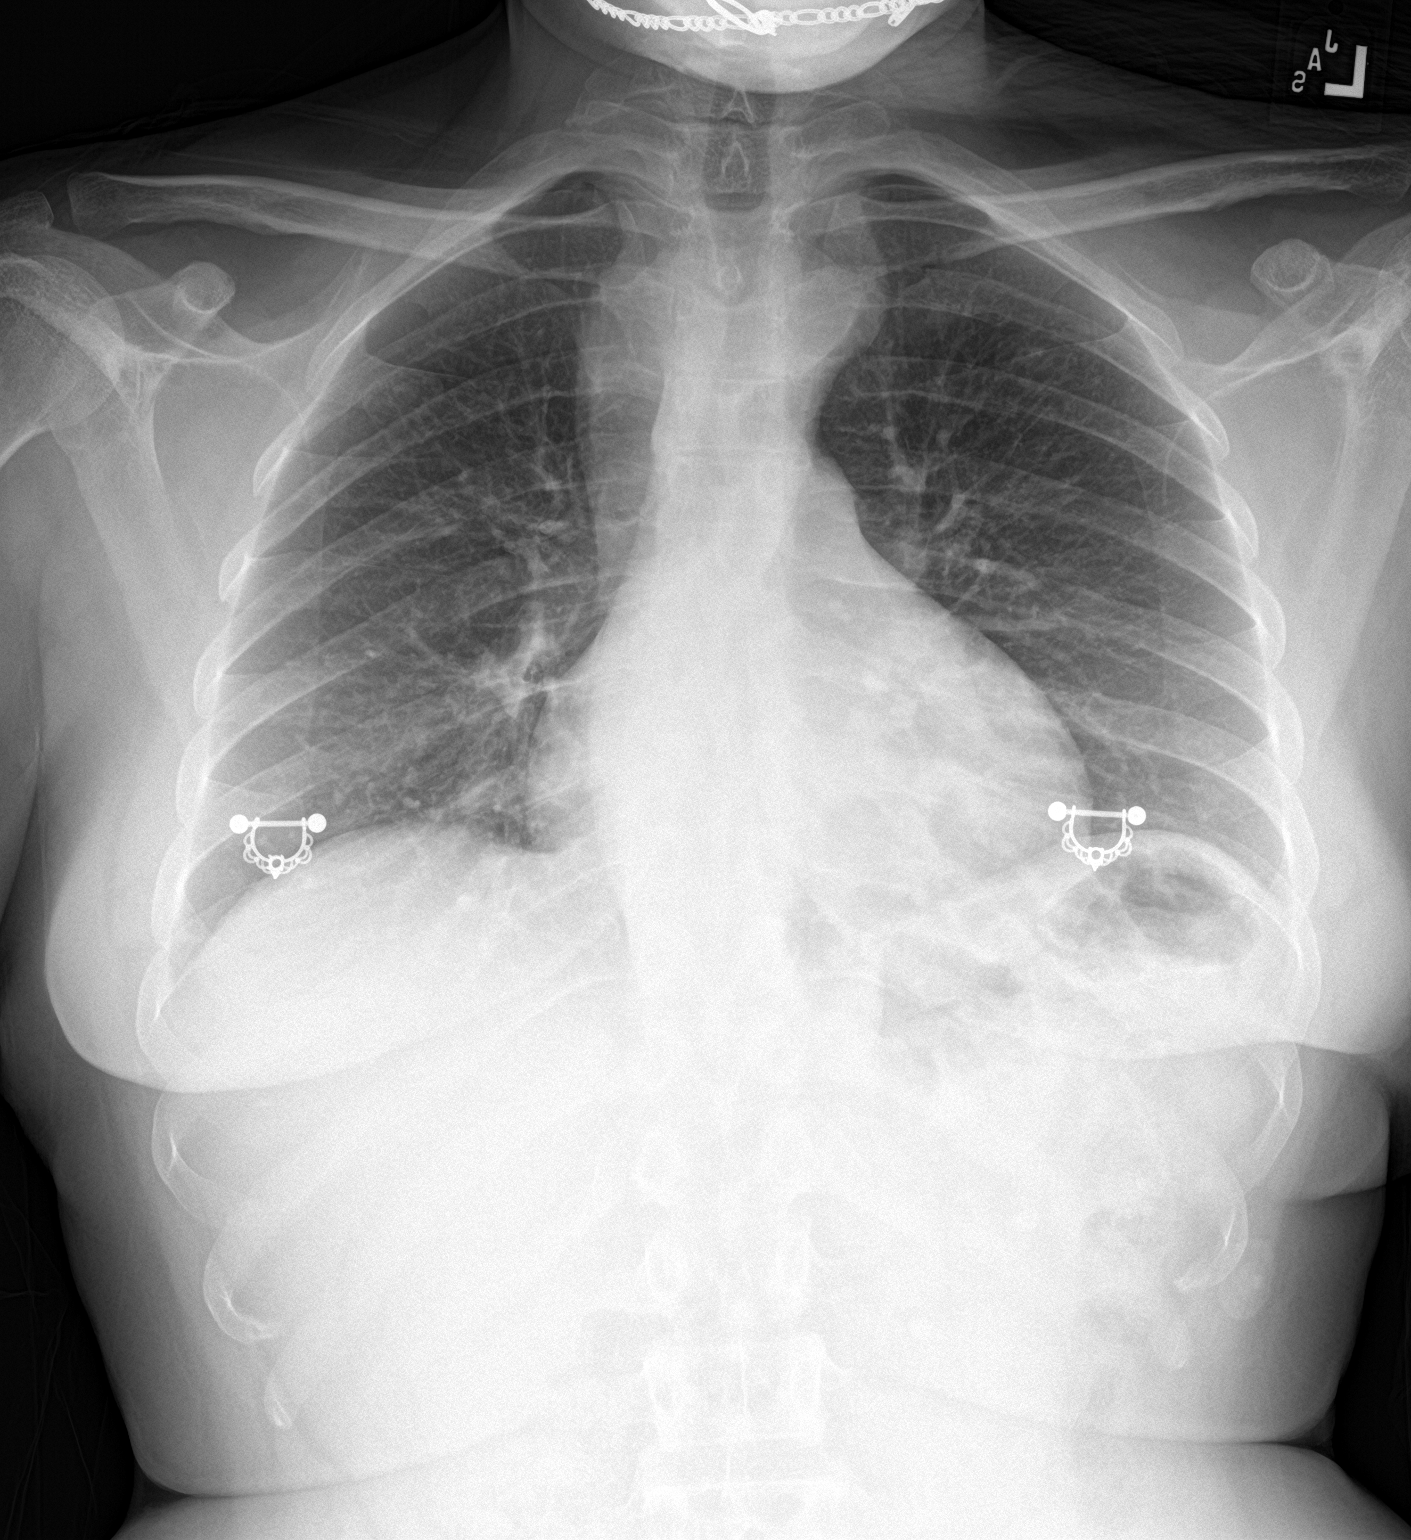

[chest lat]
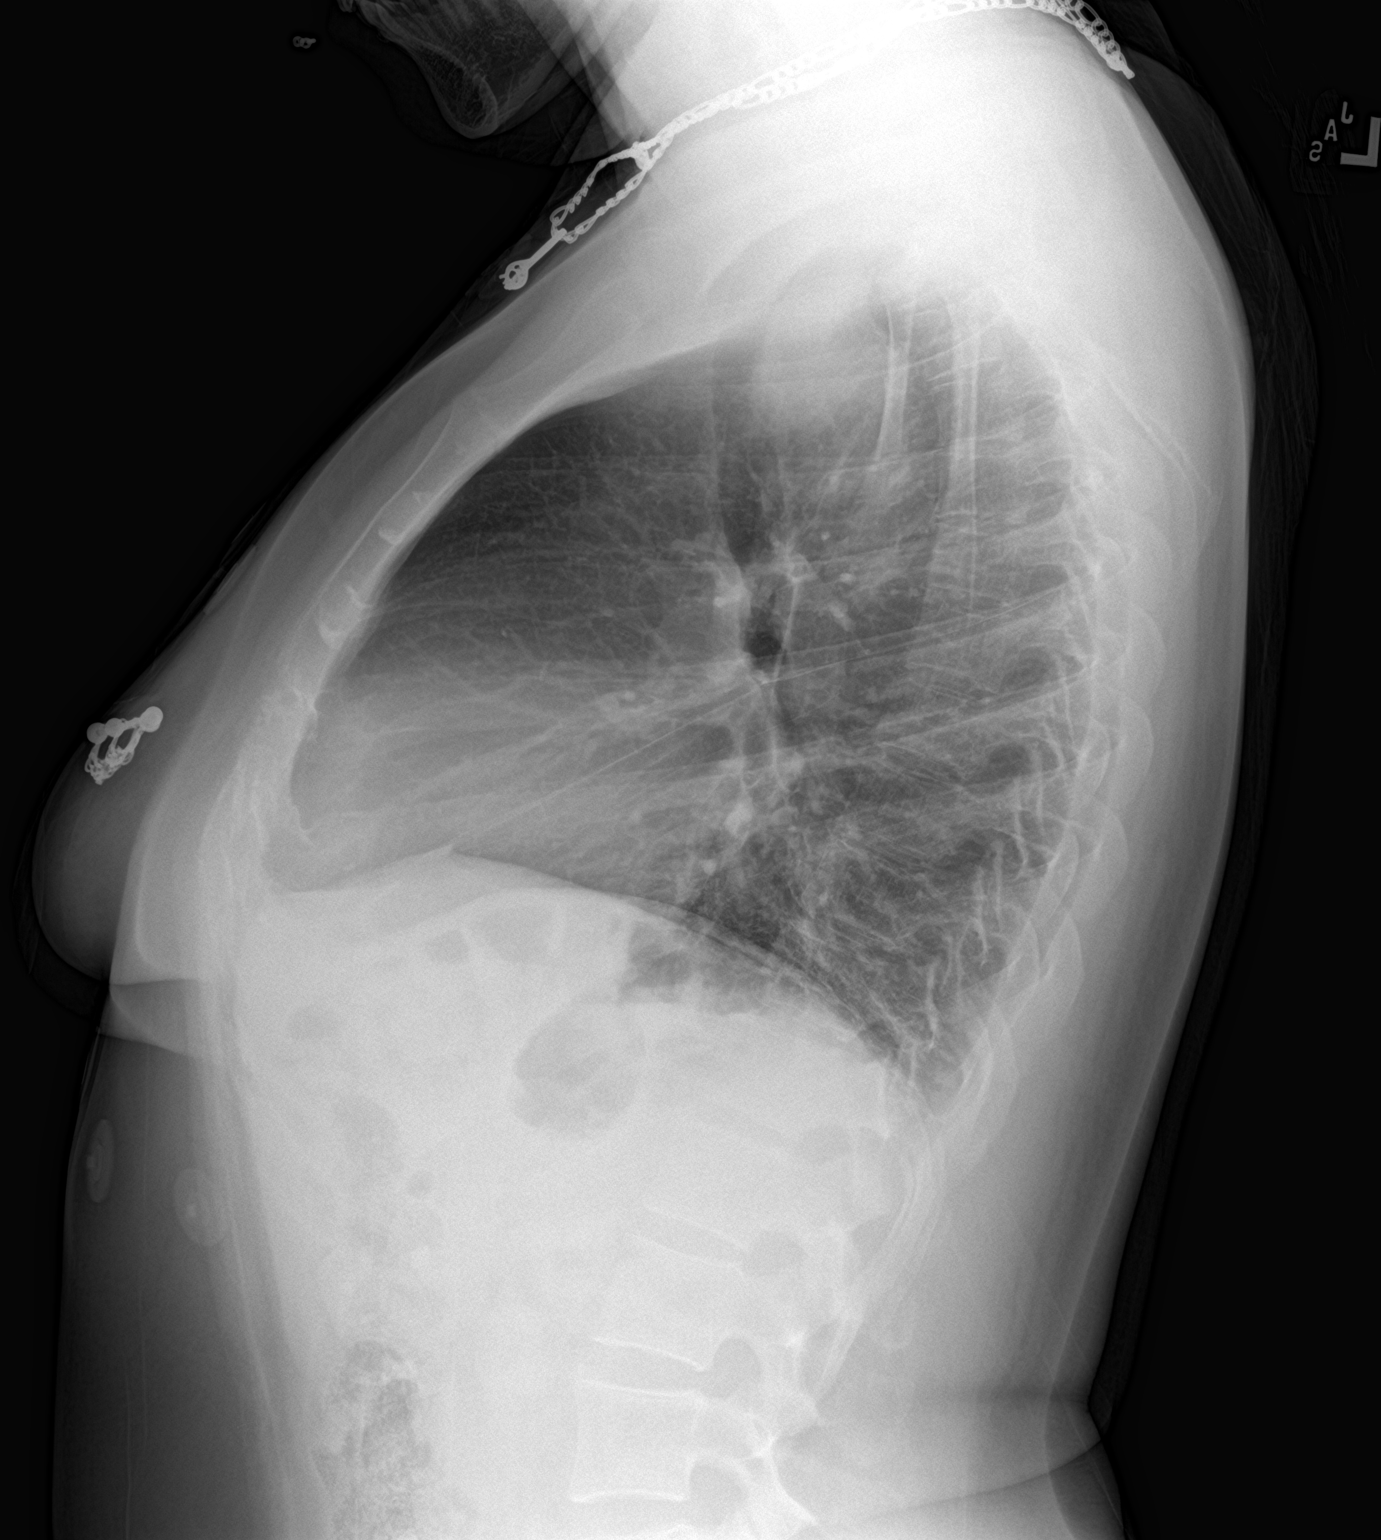

[2 of 2 positions shown; findings below may reference images not displayed]

FINDINGS: The heart size is normal. Prominence of the superior mediastinum,
corresponding to right-sided aortic arch identified by prior CT.
Both lungs are clear. The visualized skeletal structures are
unremarkable.
IMPRESSION: 1. No acute abnormality of the lungs.
2. Prominence of the superior mediastinum, corresponding to
right-sided aortic arch identified by prior CT.

## 2023-01-24 ENCOUNTER — Emergency Department (HOSPITAL_COMMUNITY): Payer: Medicaid Other

## 2023-01-24 ENCOUNTER — Encounter (HOSPITAL_COMMUNITY): Payer: Self-pay

## 2023-01-24 ENCOUNTER — Other Ambulatory Visit: Payer: Self-pay

## 2023-01-24 ENCOUNTER — Emergency Department (HOSPITAL_COMMUNITY)
Admission: EM | Admit: 2023-01-24 | Discharge: 2023-01-24 | Disposition: A | Discharge status: Home / Self Care | Payer: Medicaid Other | Attending: Emergency Medicine | Admitting: Emergency Medicine

## 2023-01-24 DIAGNOSIS — I10 Essential (primary) hypertension: Secondary | ICD-10-CM | POA: Insufficient documentation

## 2023-01-24 DIAGNOSIS — D649 Anemia, unspecified: Secondary | ICD-10-CM | POA: Insufficient documentation

## 2023-01-24 DIAGNOSIS — G43809 Other migraine, not intractable, without status migrainosus: Secondary | ICD-10-CM | POA: Insufficient documentation

## 2023-01-24 DIAGNOSIS — R0789 Other chest pain: Secondary | ICD-10-CM

## 2023-01-24 HISTORY — DX: Essential (primary) hypertension: I10

## 2023-01-24 LAB — BPAM RBC
Blood Product Expiration Date: 202408082359
Unit Type and Rh: 5100

## 2023-01-24 LAB — PREPARE RBC (CROSSMATCH)

## 2023-01-24 LAB — CBC
HCT: 24.6 % — ABNORMAL LOW (ref 36.0–46.0)
Hemoglobin: 6.5 g/dL — CL (ref 12.0–15.0)
MCH: 16.4 pg — ABNORMAL LOW (ref 26.0–34.0)
MCHC: 26.4 g/dL — ABNORMAL LOW (ref 30.0–36.0)
MCV: 62 fL — ABNORMAL LOW (ref 80.0–100.0)
Platelets: 341 10*3/uL (ref 150–400)
RBC: 3.97 MIL/uL (ref 3.87–5.11)
RDW: 21.3 % — ABNORMAL HIGH (ref 11.5–15.5)
WBC: 4.4 10*3/uL (ref 4.0–10.5)
nRBC: 0 % (ref 0.0–0.2)

## 2023-01-24 LAB — BASIC METABOLIC PANEL
Anion gap: 13 (ref 5–15)
BUN: 7 mg/dL (ref 6–20)
CO2: 23 mmol/L (ref 22–32)
Calcium: 8.9 mg/dL (ref 8.9–10.3)
Chloride: 103 mmol/L (ref 98–111)
Creatinine, Ser: 0.67 mg/dL (ref 0.44–1.00)
GFR, Estimated: >60 mL/min (ref >60)
Glucose, Bld: 90 mg/dL (ref 70–99)
Potassium: 3.5 mmol/L (ref 3.5–5.1)
Sodium: 139 mmol/L (ref 135–145)

## 2023-01-24 LAB — TYPE AND SCREEN
ABO/RH(D): O POS
Antibody Screen: NEGATIVE

## 2023-01-24 LAB — TROPONIN I (HIGH SENSITIVITY)
Troponin I (High Sensitivity): 3 ng/L (ref <18)
Troponin I (High Sensitivity): 3 ng/L (ref <18)

## 2023-01-24 LAB — HCG, SERUM, QUALITATIVE: Preg, Serum: NEGATIVE

## 2023-01-24 MED ORDER — BUTALBITAL-APAP-CAFFEINE 50-325-40 MG PO TABS
2.0000 | ORAL_TABLET | Freq: Once | ORAL | Status: AC
  Administered 2023-01-24: 2 via ORAL
  Filled 2023-01-24: qty 2

## 2023-01-24 MED ORDER — AMLODIPINE BESYLATE 5 MG PO TABS: 5.0000 mg | ORAL_TABLET | Freq: Every day | ORAL | 3 refills | Status: AC

## 2023-01-24 MED ORDER — BUTALBITAL-APAP-CAFFEINE 50-325-40 MG PO TABS: 1.0000 | ORAL_TABLET | Freq: Four times a day (QID) | ORAL | 0 refills | Status: AC | PRN

## 2023-01-24 MED ORDER — METOCLOPRAMIDE HCL 5 MG/ML IJ SOLN
10.0000 mg | Freq: Once | INTRAMUSCULAR | Status: AC
  Administered 2023-01-24: 10 mg via INTRAVENOUS
  Filled 2023-01-24: qty 2

## 2023-01-24 MED ORDER — FERROUS SULFATE 325 (65 FE) MG PO TABS: 325.0000 mg | ORAL_TABLET | Freq: Every day | ORAL | 0 refills | Status: AC

## 2023-01-24 MED ORDER — DIPHENHYDRAMINE HCL 50 MG/ML IJ SOLN
25.0000 mg | Freq: Once | INTRAMUSCULAR | Status: AC
  Administered 2023-01-24: 25 mg via INTRAVENOUS
  Filled 2023-01-24: qty 1

## 2023-01-24 MED ORDER — SODIUM CHLORIDE 0.9% IV SOLUTION: Freq: Once | INTRAVENOUS | Status: AC

## 2023-01-24 MED ORDER — KETOROLAC TROMETHAMINE 15 MG/ML IJ SOLN
15.0000 mg | Freq: Once | INTRAMUSCULAR | Status: AC
  Administered 2023-01-24: 15 mg via INTRAVENOUS
  Filled 2023-01-24: qty 1

## 2023-01-24 NOTE — Discharge Instructions (Addendum)
Return for any problem.   Resume Norvasc as prescribed to treat elevated blood pressure.  Resume iron supplements as instructed to treat anemia.  It is important that you closely follow-up with your regular care provider for reevaluation of your anemia and your blood pressure.

## 2023-01-24 NOTE — ED Provider Notes (Signed)
[place] EMERGENCY DEPARTMENT AT [facility] Provider Note   CSN: [id number] Arrival date & time: 01/24/23  1150     History {Add pertinent medical, surgical, social history, OB history to HPI:1} Chief Complaint  Patient presents with   Chest Pain   Headache   Hypertension    Bailey Sloan is a 37 y.o. female.  37 year old female with prior medical history as detailed below for evaluation.  Patient reports that she woke up with a headache.  Headache did not improve despite taking Tylenol at home this morning.  She reports then going to work.  While at work the headache worsened and she then developed anterior chest discomfort.    Patient with history of hypertension.  She does not currently take antihypertensives.  Patient with history of anemia.  Patient has been told to take B12, iron supplementation.  She does not currently do so.  Her last transfusion was in 2022 when she was admitted for workup of anemia.  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  Patient reports that headache today is consistent with typical migraine headache that she has had previously.  Patient does report mild symptoms of easy fatigability and chronic tiredness over the last several weeks.  These are consistent with prior symptoms when she was anemic and required transfusion.  The history is provided by the patient and medical records.       Home Medications Prior to Admission medications   Medication Sig Start Date End Date Taking? Authorizing Provider  chlorpheniramine-HYDROcodone (TUSSIONEX) 10-8 MG/5ML SUER Take 5 mLs by mouth every 12 (twelve) hours as needed for cough. 11/27/20   Dolan Amen, MD  ferrous sulfate 325 (65 FE) MG tablet Take 1 tablet (325 mg total) by mouth every other day for 30 doses. 11/27/20 01/25/21  Dolan Amen, MD  guaiFENesin-dextromethorphan (ROBITUSSIN  DM) 100-10 MG/5ML syrup Take 10 mLs by mouth every 4 (four) hours as needed for cough. 11/27/20   Dolan Amen, MD  medroxyPROGESTERone Acetate 150 MG/ML SUSY Inject 150 mg into the muscle every 3 (three) months. 11/06/20   [provider]  pantoprazole (PROTONIX) 40 MG tablet Take 1 tablet (40 mg total) by mouth daily. 11/27/20   Dolan Amen, MD  vitamin B-12 100 MCG tablet Take 1 tablet (100 mcg total) by mouth daily. 11/27/20   Dolan Amen, MD      Allergies    Patient has no known allergies.    Review of Systems   Review of Systems  All other systems reviewed and are negative.   Physical Exam Updated Vital Signs BP (!) 170/104   Pulse 80   Temp 98.2 F (36.8 C) (Oral)   Resp 16   Ht 5\' 4"  (1.626 m)   Wt 79.8 kg   LMP 01/22/2023 (Approximate)   SpO2 100%   BMI 30.21 kg/m  Physical Exam Vitals and nursing note reviewed.  Constitutional:      General: She is not in acute distress.    Appearance: Normal appearance. She is well-developed.  HENT:     Head: Normocephalic and atraumatic.  Eyes:     Conjunctiva/sclera: Conjunctivae normal.     Pupils: Pupils are equal, round, and reactive to light.  Cardiovascular:     Rate and Rhythm: Normal rate and regular rhythm.     Heart sounds: Normal heart sounds.  Pulmonary:     Effort: Pulmonary effort is normal.  No respiratory distress.     Breath sounds: Normal breath sounds.  Abdominal:     General: There is no distension.     Palpations: Abdomen is soft.     Tenderness: There is no abdominal tenderness.  Musculoskeletal:        General: No deformity. Normal range of motion.     Cervical back: Normal range of motion and neck supple.  Skin:    General: Skin is warm and dry.  Neurological:     General: No focal deficit present.     Mental Status: She is alert and oriented to person, place, and time.     ED Results / Procedures / Treatments   Labs (all labs ordered are listed, but only abnormal results  are displayed) Labs Reviewed  CBC - Abnormal; Notable for the following components:      Result Value   Hemoglobin 6.5 (*)    HCT 24.6 (*)    MCV 62.0 (*)    MCH 16.4 (*)    MCHC 26.4 (*)    RDW 21.3 (*)    All other components within normal limits  BASIC METABOLIC PANEL  HCG, SERUM, QUALITATIVE  PREPARE RBC (CROSSMATCH)  TYPE AND SCREEN  TROPONIN I (HIGH SENSITIVITY)  TROPONIN I (HIGH SENSITIVITY)    EKG EKG Interpretation Date/Time:  Friday January 24 2023 11:57:47 EDT Ventricular Rate:  68 PR Interval:  118 QRS Duration:  92 QT Interval:  424 QTC Calculation: 450 R Axis:   75  Text Interpretation: Normal sinus rhythm Normal ECG When compared with ECG of 26-Nov-2020 20:21, PREVIOUS ECG IS PRESENT Confirmed by Kristine Royal [phone]) on 01/24/2023 1:19:02 PM  Radiology DG Chest 2 View  Result Date: 01/24/2023 CLINICAL DATA:  Chest tightness, headache. EXAM: CHEST - 2 VIEW COMPARISON:  Chest radiograph 11/26/2020 FINDINGS: The cardiomediastinal silhouette is stable, with an unchanged right-sided aortic arch. There is no focal consolidation or pulmonary edema. There is no pleural effusion or pneumothorax There is no acute osseous abnormality. IMPRESSION: No radiographic evidence of acute cardiopulmonary process. Electronically Signed   By: Lesia Hausen M.D.   On: 01/24/2023 12:35    Procedures Procedures  {Document cardiac monitor, telemetry assessment procedure when appropriate:1}  Medications Ordered in ED Medications  metoCLOPramide (REGLAN) injection 10 mg (has no administration in time range)  ketorolac (TORADOL) 15 MG/ML injection 15 mg (has no administration in time range)  diphenhydrAMINE (BENADRYL) injection 25 mg (has no administration in time range)  0.9 %  sodium chloride infusion (Manually program via Guardrails IV Fluids) (has no administration in time range)    ED Course/ Medical Decision Making/ A&P   {   Click here for ABCD2, HEART and other  calculatorsREFRESH Note before signing :1}                          Medical Decision Making Amount and/or Complexity of Data Reviewed Labs: ordered. Radiology: ordered.  Risk Prescription drug management.    Medical Screen Complete  This patient presented to the ED with complaint of headache, weakness, chest discomfort.  This complaint involves an extensive number of treatment options. The initial differential diagnosis includes, but is not limited to, anemia, migraine, ACS, metabolic abnormality, etc.  This presentation is: Acute, Chronic, Self-Limited, Previously Undiagnosed, Uncertain Prognosis, Complicated, Systemic Symptoms, and Threat to Life/Bodily Function  Patient with known history of hypertension, anemia presents with complaint of headache and atypical chest discomfort.  Patient's hemoglobin  is 6.5 today.  Many of patient's symptoms described are consistent with symptomatic anemia.  Patient is agreeable with plan to transfuse today.  Patient with prior workup of her anemia which demonstrated multiple likely causation's including iron deficiency secondary to heavy menses and absorptive difficulties secondary to history of gastric sleeve.  Patient is agreeable with transfusion today.  Patient does understand need to resume taking her iron supplements and B12.  Patient also understands need for close outpatient follow-up with [facility].  Patient's described headache is most consistent with likely migraine.  Patient with history of same.  Patient's describe chest pain is atypical.  EKG is without evidence of acute ischemia.  Troponin x 2 is without significant elevation or delta.    Additional history obtained:  External records from outside sources obtained and reviewed including prior ED visits and prior Inpatient records.    Lab Tests:  I ordered and personally interpreted labs.    Imaging Studies ordered:  I ordered imaging studies including chest x-ray I  independently visualized and interpreted obtained imaging which showed NAD I agree with the radiologist interpretation.   Cardiac Monitoring:  The patient was maintained on a cardiac monitor.  I personally viewed and interpreted the cardiac monitor which showed an underlying rhythm of: NSR   Medicines ordered:  I ordered medication including PRBC transfusion, Benadryl, Toradol, Reglan for symptomatic anemia, migraine Reevaluation of the patient after these medicines showed that the patient: improved    Problem List / ED Course:  Symptomatic anemia, atypical chest pain, migraine   Reevaluation:  After the interventions noted above, I reevaluated the patient and found that they have: improved   Disposition:  After consideration of the diagnostic results and the patients response to treatment, I feel that the patent would benefit from close outpatient follow-up.    {Document critical care time when appropriate:1} {Document review of labs and clinical decision tools ie heart score, Chads2Vasc2 etc:1}  {Document your independent review of radiology images, and any outside records:1} {Document your discussion with family members, caretakers, and with consultants:1} {Document social determinants of health affecting pt's care:1} {Document your decision making why or why not admission, treatments were needed:1} Final Clinical Impression(s) / ED Diagnoses Final diagnoses:  Atypical chest pain  Other migraine without status migrainosus, not intractable  Anemia, unspecified type    Rx / DC Orders ED Discharge Orders          Ordered    amLODipine (NORVASC) 5 MG tablet  Daily        01/24/23 1423    ferrous sulfate 325 (65 FE) MG tablet  Daily        01/24/23 1423

## 2023-01-24 NOTE — ED Provider Notes (Signed)
Care of patient received from prior provider at 4:12 PM, please see their note for complete H/P and care plan.  Received handoff per ED course.  Clinical Course as of 01/24/23 1931  Fri Jan 24, 2023  1610 Stable HO from PCM  30 YOF with headache and chest discomfort. Chronic medical problems. 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   Getting transfused. Plan for DC after transfusion. [CC]  1838 Reassessed, headache initially improved now returned.  Give patient Fioricet.  Patient states that nausea has grossly improved and she would like to trial p.o. intake. States her headache usually occurs when she is anemic.  States she has a history of vaginal bleeding we will plan to follow-up with her gynecologist. [CC]    Clinical Course User Index [CC] Glyn Ade, MD    Reassessment: She has already made appointments with her PCP.  Continues to improve.  Patient stable for discharge with planned outpatient follow-up.     Glyn Ade, MD 01/24/23 1932

## 2023-01-24 NOTE — ED Triage Notes (Signed)
Pt reports waking up with a headache, went to work and then developed chest tightness, pt states she has hx of HTN but was taken off meds years ago but has noticed over the past few weeks her BP has been increasing to almost 200 SBP. Pt a.o, nad noted

## 2023-01-25 LAB — TYPE AND SCREEN: Unit division: 0

## 2023-01-25 LAB — BPAM RBC: ISSUE DATE / TIME: 202407121545

## 2023-10-29 ENCOUNTER — Emergency Department (HOSPITAL_COMMUNITY): Payer: Self-pay

## 2023-10-29 ENCOUNTER — Emergency Department (HOSPITAL_COMMUNITY)
Admission: EM | Admit: 2023-10-29 | Discharge: 2023-10-29 | Disposition: A | Discharge status: Nursing Home (Includes ICF) | Payer: Self-pay

## 2023-10-29 ENCOUNTER — Other Ambulatory Visit: Payer: Self-pay

## 2023-10-29 ENCOUNTER — Encounter (HOSPITAL_COMMUNITY): Payer: Self-pay | Admitting: Emergency Medicine

## 2023-10-29 ENCOUNTER — Encounter (HOSPITAL_COMMUNITY): Payer: Self-pay

## 2023-10-29 DIAGNOSIS — D72829 Elevated white blood cell count, unspecified: Secondary | ICD-10-CM | POA: Insufficient documentation

## 2023-10-29 DIAGNOSIS — E876 Hypokalemia: Secondary | ICD-10-CM | POA: Insufficient documentation

## 2023-10-29 DIAGNOSIS — I10 Essential (primary) hypertension: Secondary | ICD-10-CM | POA: Insufficient documentation

## 2023-10-29 DIAGNOSIS — N390 Urinary tract infection, site not specified: Secondary | ICD-10-CM

## 2023-10-29 DIAGNOSIS — Z79899 Other long term (current) drug therapy: Secondary | ICD-10-CM | POA: Insufficient documentation

## 2023-10-29 DIAGNOSIS — J069 Acute upper respiratory infection, unspecified: Secondary | ICD-10-CM | POA: Insufficient documentation

## 2023-10-29 DIAGNOSIS — R1031 Right lower quadrant pain: Secondary | ICD-10-CM | POA: Insufficient documentation

## 2023-10-29 LAB — COMPREHENSIVE METABOLIC PANEL WITH GFR
ALT: 8 U/L (ref 0–44)
AST: 10 U/L — ABNORMAL LOW (ref 15–41)
Albumin: 3.9 g/dL (ref 3.5–5.0)
Alkaline Phosphatase: 51 U/L (ref 38–126)
Anion gap: 12 (ref 5–15)
BUN: 10 mg/dL (ref 6–20)
CO2: 19 mmol/L — ABNORMAL LOW (ref 22–32)
Calcium: 8.9 mg/dL (ref 8.9–10.3)
Chloride: 106 mmol/L (ref 98–111)
Creatinine, Ser: 0.94 mg/dL (ref 0.44–1.00)
GFR, Estimated: >60 mL/min (ref >60)
Glucose, Bld: 123 mg/dL — ABNORMAL HIGH (ref 70–99)
Potassium: 3.1 mmol/L — ABNORMAL LOW (ref 3.5–5.1)
Sodium: 137 mmol/L (ref 135–145)
Total Bilirubin: 0.9 mg/dL (ref 0.0–1.2)
Total Protein: 8 g/dL (ref 6.5–8.1)

## 2023-10-29 LAB — CBC WITH DIFFERENTIAL/PLATELET
Abs Immature Granulocytes: 0.19 10*3/uL — ABNORMAL HIGH (ref 0.00–0.07)
Basophils Absolute: 0 10*3/uL (ref 0.0–0.1)
Basophils Relative: 0 %
Eosinophils Absolute: 0 10*3/uL (ref 0.0–0.5)
Eosinophils Relative: 0 %
HCT: 27 % — ABNORMAL LOW (ref 36.0–46.0)
Hemoglobin: 7.4 g/dL — ABNORMAL LOW (ref 12.0–15.0)
Immature Granulocytes: 1 %
Lymphocytes Relative: 3 %
Lymphs Abs: 0.6 10*3/uL — ABNORMAL LOW (ref 0.7–4.0)
MCH: 17.7 pg — ABNORMAL LOW (ref 26.0–34.0)
MCHC: 27.4 g/dL — ABNORMAL LOW (ref 30.0–36.0)
MCV: 64.6 fL — ABNORMAL LOW (ref 80.0–100.0)
Monocytes Absolute: 1.3 10*3/uL — ABNORMAL HIGH (ref 0.1–1.0)
Monocytes Relative: 7 %
Neutro Abs: 18.1 10*3/uL — ABNORMAL HIGH (ref 1.7–7.7)
Neutrophils Relative %: 89 %
Platelets: 268 10*3/uL (ref 150–400)
RBC: 4.18 MIL/uL (ref 3.87–5.11)
RDW: 21.5 % — ABNORMAL HIGH (ref 11.5–15.5)
WBC: 20.2 10*3/uL — ABNORMAL HIGH (ref 4.0–10.5)
nRBC: 0 % (ref 0.0–0.2)

## 2023-10-29 LAB — URINALYSIS, ROUTINE W REFLEX MICROSCOPIC
Bilirubin Urine: NEGATIVE
Glucose, UA: NEGATIVE mg/dL
Ketones, ur: NEGATIVE mg/dL
Leukocytes,Ua: NEGATIVE
Nitrite: NEGATIVE
Protein, ur: >=300 mg/dL — AB
Specific Gravity, Urine: 1.031 — ABNORMAL HIGH (ref 1.005–1.030)
pH: 5 (ref 5.0–8.0)

## 2023-10-29 LAB — CBC
HCT: 26.5 % — ABNORMAL LOW (ref 36.0–46.0)
Hemoglobin: 7.4 g/dL — ABNORMAL LOW (ref 12.0–15.0)
MCH: 17.8 pg — ABNORMAL LOW (ref 26.0–34.0)
MCHC: 27.9 g/dL — ABNORMAL LOW (ref 30.0–36.0)
MCV: 63.9 fL — ABNORMAL LOW (ref 80.0–100.0)
Platelets: 269 10*3/uL (ref 150–400)
RBC: 4.15 MIL/uL (ref 3.87–5.11)
RDW: 21 % — ABNORMAL HIGH (ref 11.5–15.5)
WBC: 19.9 10*3/uL — ABNORMAL HIGH (ref 4.0–10.5)
nRBC: 0 % (ref 0.0–0.2)

## 2023-10-29 LAB — SARS CORONAVIRUS 2 BY RT PCR: SARS Coronavirus 2 by RT PCR: NEGATIVE

## 2023-10-29 LAB — LIPASE, BLOOD: Lipase: 24 U/L (ref 11–51)

## 2023-10-29 LAB — HCG, SERUM, QUALITATIVE: Preg, Serum: NEGATIVE

## 2023-10-29 MED ORDER — POTASSIUM CHLORIDE CRYS ER 20 MEQ PO TBCR
40.0000 meq | EXTENDED_RELEASE_TABLET | Freq: Once | ORAL | Status: AC
  Administered 2023-10-29: 40 meq via ORAL
  Filled 2023-10-29: qty 2

## 2023-10-29 MED ORDER — SODIUM CHLORIDE 0.9 % IV BOLUS
1000.0000 mL | Freq: Once | INTRAVENOUS | Status: AC
  Administered 2023-10-29: 1000 mL via INTRAVENOUS

## 2023-10-29 MED ORDER — CEPHALEXIN 500 MG PO CAPS: 500.0000 mg | ORAL_CAPSULE | Freq: Four times a day (QID) | ORAL | 0 refills | Status: AC

## 2023-10-29 MED ORDER — SODIUM CHLORIDE 0.9 % IV SOLN
1.0000 g | Freq: Once | INTRAVENOUS | Status: AC
  Administered 2023-10-29: 1 g via INTRAVENOUS
  Filled 2023-10-29: qty 10

## 2023-10-29 MED ORDER — KETOROLAC TROMETHAMINE 15 MG/ML IJ SOLN
15.0000 mg | Freq: Once | INTRAMUSCULAR | Status: AC
  Administered 2023-10-29: 15 mg via INTRAVENOUS
  Filled 2023-10-29: qty 1

## 2023-10-29 MED ORDER — CEPHALEXIN 250 MG PO CAPS: 500.0000 mg | ORAL_CAPSULE | Freq: Once | ORAL | Status: DC

## 2023-10-29 MED ORDER — LACTATED RINGERS IV BOLUS
1000.0000 mL | Freq: Once | INTRAVENOUS | Status: AC
  Administered 2023-10-29: 1000 mL via INTRAVENOUS

## 2023-10-29 MED ORDER — PROCHLORPERAZINE EDISYLATE 10 MG/2ML IJ SOLN
10.0000 mg | Freq: Once | INTRAMUSCULAR | Status: AC
  Administered 2023-10-29: 10 mg via INTRAVENOUS
  Filled 2023-10-29: qty 2

## 2023-10-29 MED ORDER — ACETAMINOPHEN 500 MG PO TABS
1000.0000 mg | ORAL_TABLET | Freq: Four times a day (QID) | ORAL | Status: DC | PRN
  Administered 2023-10-29: 1000 mg via ORAL
  Filled 2023-10-29: qty 2

## 2023-10-29 MED ORDER — ONDANSETRON HCL 4 MG/2ML IJ SOLN
4.0000 mg | Freq: Four times a day (QID) | INTRAMUSCULAR | Status: DC | PRN
  Administered 2023-10-29: 4 mg via INTRAVENOUS
  Filled 2023-10-29: qty 2

## 2023-10-29 MED ORDER — ONDANSETRON 4 MG PO TBDP: 4.0000 mg | ORAL_TABLET | Freq: Once | ORAL | Status: DC

## 2023-10-29 MED ORDER — DIPHENHYDRAMINE HCL 50 MG/ML IJ SOLN
25.0000 mg | Freq: Once | INTRAMUSCULAR | Status: AC
  Administered 2023-10-29: 25 mg via INTRAVENOUS
  Filled 2023-10-29: qty 1

## 2023-10-29 MED ORDER — IOHEXOL 350 MG/ML SOLN
75.0000 mL | Freq: Once | INTRAVENOUS | Status: AC | PRN
  Administered 2023-10-29: 75 mL via INTRAVENOUS

## 2023-10-29 NOTE — ED Notes (Signed)
 Pt c/o pain. Messaged pa bc not at desk. Md aware

## 2023-10-29 NOTE — ED Triage Notes (Signed)
 Pt c/o weakness, vomiting, bodyaches, HAx2d.

## 2023-10-29 NOTE — ED Provider Notes (Signed)
 Received patient from previous provider pending CT final read.  See her note.  In short, patient presents to emergency department for evaluation of rhinorrhea, congestion, sore throat, dry cough, nausea, vomiting, headache, chills for the past 2 days  ED workup was notable for leukocytosis of 19.9, potassium 3.1, hemoglobin 7.4.  Urine shows RBC, WBC, bacteria but is also contaminated.  On exam, patient appears uncomfortable 2/2 to headache. She also has photophobia. Hx of migraines and feels similar. Provided migraine cocktail significantly improving symptoms. Also is TTP of RLQ therefore had CT imaging. Showed UTI, possible pyelo. Denies urinary symptoms but will treat due to CT findings. No known UTI in past. UA contaminated and unhelpful. Provided 1g rocephin , LR in ED with keflex  prescription. Culture pending.  Patient is chronically anemic with baseline 7.2-9.1 over past 48 hours.  Potassium supplementation was provided by previous provider.  While CT read is pending, patient spiked a fever 103.2 F.  Tylenol  was provided for fever.  Has been tachycardic since in ED. following Tylenol , fever resolved  Upon reassessment, patient reports improvement of symptoms.  She is much more comfortable and asking to be discharged.  I do not feel that any additional ED interventions are required at this time.  Will have patient continue with outpatient Keflex   Discussed ED workup, disposition, return to ED precautions with patient who expresses understanding agrees with plan.  All questions answered to their satisfaction.  They are agreeable to plan.  Discharge instructions provided on paperwork    Royann Cords, PA 10/30/23 1424    Carin Charleston, MD 11/03/23 [phone]

## 2023-10-29 NOTE — Discharge Instructions (Addendum)
 Thank you for letting us  evaluate you today.  We have treated your headache/migraine with a migraine cocktail here.  We treated your fever with antibiotics and Tylenol.  Your CT scan shows concern for UTI.  I am unable to confirm this with the urinalysis unfortunately as the sample was contaminated.  We do have urine culture pending if we have the correct antibiotics for you.  Please pick up antibiotics at your pharmacy on [address] and take entire course even if you are starting to feel better.  Most importantly please make sure to maintain your oral hydration with water, Pedialyte, Gatorade, chicken broth.  If you do not eat that is okay as long as you are drinking fluids  Return to emergency department if you experience intractable vomiting affecting your hydration status, significant worsening symptoms

## 2023-10-29 NOTE — ED Provider Notes (Signed)
 [place] EMERGENCY DEPARTMENT AT [facility] Provider Note   CSN: [id number] Arrival date & time: 10/29/23  8657     History  No chief complaint on file.   Bailey Sloan is a 38 y.o. female with PMHx HTN, gastric sleeve surgery who presents to ED concerned for rhinorrhea, congestion, sore throat, dry cough, nausea, vomiting, headache x2 days. Also with hot/cold flashes. Denies diarrhea, hematochezia, hematemesis, vaginal discharge.  HPI     Home Medications Prior to Admission medications   Medication Sig Start Date End Date Taking? Authorizing Provider  amLODipine (NORVASC) 5 MG tablet Take 1 tablet (5 mg total) by mouth daily. 01/24/23   Burnette Carte, MD  butalbital-acetaminophen-caffeine (FIORICET) 50-325-40 MG tablet Take 1-2 tablets by mouth every 6 (six) hours as needed for headache. 01/24/23 01/24/24  Onetha Bile, MD  chlorpheniramine-HYDROcodone (TUSSIONEX) 10-8 MG/5ML SUER Take 5 mLs by mouth every 12 (twelve) hours as needed for cough. 11/27/20   Freada Jacobs, MD  ferrous sulfate 325 (65 FE) MG tablet Take 1 tablet (325 mg total) by mouth every other day for 30 doses. 11/27/20 01/25/21  Freada Jacobs, MD  ferrous sulfate 325 (65 FE) MG tablet Take 1 tablet (325 mg total) by mouth daily. 01/24/23   Burnette Carte, MD  guaiFENesin-dextromethorphan (ROBITUSSIN DM) 100-10 MG/5ML syrup Take 10 mLs by mouth every 4 (four) hours as needed for cough. 11/27/20   Freada Jacobs, MD  medroxyPROGESTERone Acetate 150 MG/ML SUSY Inject 150 mg into the muscle every 3 (three) months. 11/06/20   [provider]  pantoprazole (PROTONIX) 40 MG tablet Take 1 tablet (40 mg total) by mouth daily. 11/27/20   Freada Jacobs, MD  vitamin B-12 100 MCG tablet Take 1 tablet (100 mcg total) by mouth daily. 11/27/20   Freada Jacobs, MD      Allergies    Patient has no known allergies.    Review of Systems   Review of Systems  Respiratory:  Positive for cough.      Physical Exam Updated Vital Signs BP 128/89   Pulse (!) 101   Temp 98.2 F (36.8 C)   Resp 18   Ht 5\' 4"  (1.626 m)   Wt 79.8 kg   SpO2 99%   BMI 30.20 kg/m  Physical Exam Vitals and nursing note reviewed.  Constitutional:      General: She is not in acute distress.    Appearance: She is not ill-appearing or toxic-appearing.  HENT:     Head: Normocephalic and atraumatic.     Mouth/Throat:     Mouth: Mucous membranes are moist.     Pharynx: No oropharyngeal exudate or posterior oropharyngeal erythema.     Comments: +1 tonsils BL Eyes:     General: No scleral icterus.       Right eye: No discharge.        Left eye: No discharge.     Conjunctiva/sclera: Conjunctivae normal.  Cardiovascular:     Rate and Rhythm: Normal rate and regular rhythm.     Pulses: Normal pulses.     Heart sounds: Normal heart sounds. No murmur heard. Pulmonary:     Effort: Pulmonary effort is normal. No respiratory distress.     Breath sounds: Normal breath sounds. No wheezing, rhonchi or rales.  Abdominal:     General: Abdomen is flat. Bowel sounds are normal. There is no distension.     Palpations: Abdomen is soft. There is no mass.     Tenderness: There  is abdominal tenderness.     Comments: RLQ tenderness to palpation.  Musculoskeletal:     Right lower leg: No edema.     Left lower leg: No edema.  Skin:    General: Skin is warm and dry.     Findings: No rash.  Neurological:     General: No focal deficit present.     Mental Status: She is alert and oriented to person, place, and time. Mental status is at baseline.  Psychiatric:        Mood and Affect: Mood normal.        Behavior: Behavior normal.     ED Results / Procedures / Treatments   Labs (all labs ordered are listed, but only abnormal results are displayed) Labs Reviewed  COMPREHENSIVE METABOLIC PANEL WITH GFR - Abnormal; Notable for the following components:      Result Value   Potassium 3.1 (*)    CO2 19 (*)     Glucose, Bld 123 (*)    AST 10 (*)    All other components within normal limits  CBC - Abnormal; Notable for the following components:   WBC 19.9 (*)    Hemoglobin 7.4 (*)    HCT 26.5 (*)    MCV 63.9 (*)    MCH 17.8 (*)    MCHC 27.9 (*)    RDW 21.0 (*)    All other components within normal limits  URINALYSIS, ROUTINE W REFLEX MICROSCOPIC - Abnormal; Notable for the following components:   Color, Urine AMBER (*)    APPearance CLOUDY (*)    Specific Gravity, Urine 1.031 (*)    Hgb urine dipstick SMALL (*)    Protein, ur >=300 (*)    Bacteria, UA MANY (*)    All other components within normal limits  SARS CORONAVIRUS 2 BY RT PCR  URINE CULTURE  LIPASE, BLOOD  HCG, SERUM, QUALITATIVE  DIFFERENTIAL    EKG None  Radiology No results found.  Procedures Procedures    Medications Ordered in ED Medications  ondansetron (ZOFRAN) injection 4 mg (4 mg Intravenous Given 10/29/23 1348)  potassium chloride SA (KLOR-CON M) CR tablet 40 mEq (40 mEq Oral Given 10/29/23 1348)  sodium chloride 0.9 % bolus 1,000 mL (1,000 mLs Intravenous New Bag/Given 10/29/23 1352)  iohexol (OMNIPAQUE) 350 MG/ML injection 75 mL (75 mLs Intravenous Contrast Given 10/29/23 1438)    ED Course/ Medical Decision Making/ A&P                                 Medical Decision Making Amount and/or Complexity of Data Reviewed Labs: ordered. Radiology: ordered.  Risk Prescription drug management.    This patient presents to the ED for concern of headache, rhinorrhea, congestion, sore throat, dry cough, vomiting, this involves an extensive number of treatment options, and is a complaint that carries with it a high risk of complications and morbidity.  The differential diagnosis includes Flu/COVID/RSV, strep pharyngitis, sinusitis, peritonsillar abscess, retropharyngeal abscess, pneumonia, meningitis.   Co morbidities that complicate the patient evaluation  HTN, gastric sleeve surgery   Additional history  obtained:  PCP within the [facility]  Problem List / ED Course / Critical interventions / Medication management  Patient presents ED with symptoms concerning for viral URI.  Physical exam did show RLQ tenderness to palpation.  Shared decision making with patient who would like to obtain CT scan today. I Ordered, and personally  interpreted labs.  hCG negative.  CMP with mild hypokalemia at 3.1.  CBC with leukocytosis at 19.9.  There is also anemia with hemoglobin at 7.4 which is near patient's baseline.  COVID-negative.  UA not concerning for infection. I ordered imaging studies including chest xray to assess for process contributing to patient's symptoms.This imaging is pending. I have reviewed the patients home medicines and have made adjustments as needed    Social Determinants of Health:  none  3:12 PM Care of Bailey Sloan transferred to [facility] at the end of my shift as the patient will require reassessment once labs/imaging have resulted. Patient presentation, ED course, and plan of care discussed with review of all pertinent labs and imaging. Please see his/her note for further details regarding further ED course and disposition. Plan at time of handoff is reassess patient after CT imaging. This may be altered or completely changed at the discretion of the oncoming team pending results of further workup.         Final Clinical Impression(s) / ED Diagnoses Final diagnoses:  Viral URI    Rx / DC Orders ED Discharge Orders     None         Walthall Bureau, [place] 10/29/23 1512    Tegeler, Marine Sia, MD 10/30/23 [phone]

## 2023-10-31 LAB — URINE CULTURE: Culture: >=100000 — AB

## 2023-11-01 ENCOUNTER — Telehealth (HOSPITAL_BASED_OUTPATIENT_CLINIC_OR_DEPARTMENT_OTHER): Payer: Self-pay

## 2023-11-01 NOTE — Telephone Encounter (Signed)
 Post ED Visit - Positive Culture Follow-up  Culture report reviewed by antimicrobial stewardship pharmacist: [facility] [x]  Sheryle Donning, [place].D. []  Skeet Duke, Pharm.D., BCPS AQ-ID []  Leslee Rase, Pharm.D., BCPS []  Garland Junk, Pharm.D., BCPS []  [place], [address].D., BCPS, AAHIVP []  Alcide Aly, Pharm.D., BCPS, AAHIVP []  Jerri Morale, PharmD, BCPS []  Graham Laws, PharmD, BCPS []  Cleda Curly, PharmD, BCPS []  Tamar Fairly, PharmD []  Ballard Levels, PharmD, BCPS []  Ollen Beverage, PharmD  [facility] []  Arlyne Bering, PharmD []  Sherryle Don, PharmD []  Van [place]elinas, PharmD []  Delila Felty, Rph []  [place]) Cleora Daft, PharmD []  Augustina Block, PharmD []  Arie Kurtz, PharmD []  Sharlyn Deaner, PharmD []  Agnes Hose, PharmD []  Kendall Pauls, PharmD []  Gladstone Lamer, PharmD []  Armanda Bern, PharmD []  Tera Fellows, PharmD   Positive urine culture Treated with Cephalexin , organism sensitive to the same and no further patient follow-up is required at this time.  Delena Feil 11/01/2023, 8:22 AM
